# Patient Record
Sex: Female | Born: 1995 | Race: Black or African American | Hispanic: No | Marital: Single | Smoking: Never smoker
Health system: Southern US, Community
[De-identification: ages and names within clinical notes are randomized; demographics above are authoritative.]

## PROBLEM LIST (undated history)

## (undated) DIAGNOSIS — A64 Unspecified sexually transmitted disease: Secondary | ICD-10-CM

## (undated) DIAGNOSIS — O009 Unspecified ectopic pregnancy without intrauterine pregnancy: Secondary | ICD-10-CM

## (undated) DIAGNOSIS — S060XAA Concussion with loss of consciousness status unknown, initial encounter: Secondary | ICD-10-CM

## (undated) DIAGNOSIS — F329 Major depressive disorder, single episode, unspecified: Secondary | ICD-10-CM

## (undated) DIAGNOSIS — F32A Depression, unspecified: Secondary | ICD-10-CM

## (undated) DIAGNOSIS — S060X9A Concussion with loss of consciousness of unspecified duration, initial encounter: Secondary | ICD-10-CM

## (undated) DIAGNOSIS — B999 Unspecified infectious disease: Secondary | ICD-10-CM

## (undated) HISTORY — PX: DILATION AND CURETTAGE OF UTERUS: SHX78

## (undated) HISTORY — PX: WISDOM TOOTH EXTRACTION: SHX21

---
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
## 2016-04-24 ENCOUNTER — Encounter (HOSPITAL_COMMUNITY): Payer: Self-pay | Admitting: Emergency Medicine

## 2016-04-24 ENCOUNTER — Ambulatory Visit (HOSPITAL_COMMUNITY)
Admission: EM | Admit: 2016-04-24 | Discharge: 2016-04-24 | Disposition: A | Discharge status: Home / Self Care | Payer: Self-pay | Attending: Family Medicine | Admitting: Family Medicine

## 2016-04-24 DIAGNOSIS — N72 Inflammatory disease of cervix uteri: Secondary | ICD-10-CM | POA: Insufficient documentation

## 2016-04-24 DIAGNOSIS — N939 Abnormal uterine and vaginal bleeding, unspecified: Secondary | ICD-10-CM | POA: Insufficient documentation

## 2016-04-24 LAB — POCT PREGNANCY, URINE: PREG TEST UR: NEGATIVE

## 2016-04-24 MED ORDER — METRONIDAZOLE 500 MG PO TABS: 500.0000 mg | ORAL_TABLET | Freq: Two times a day (BID) | ORAL | 0 refills | Status: DC

## 2016-04-24 MED ORDER — DOXYCYCLINE HYCLATE 100 MG PO CAPS: 100.0000 mg | ORAL_CAPSULE | Freq: Two times a day (BID) | ORAL | 0 refills | Status: DC

## 2016-04-24 NOTE — ED Provider Notes (Signed)
CSN: [id number]     Arrival date & time 04/24/16  1431 History   First MD Initiated Contact with Patient 04/24/16 1526     Chief Complaint  Patient presents with  . Vaginal Bleeding   (Consider location/radiation/quality/duration/timing/severity/associated sxs/prior Treatment) 20 year old female states that her LMP was 04/03/2016 in lasted for approximately 8 days. This was right on time and a normal menstrual period for her. Yesterday she had some vaginal spotting, scant to mild amount and this was associated with a small amount of an abnormal discharge. She uses panty liners rather than pads due to the small amount of discharge and bleeding. Yesterday she had 2-3 minutes of mid pelvic cramping and has had none since that time.      History reviewed. No pertinent past medical history. Past Surgical History:  Procedure Laterality Date  . WISDOM TOOTH EXTRACTION     Family History  Problem Relation Age of Onset  . Adopted: Yes  . Cancer Mother    Social History  Substance Use Topics  . Smoking status: Never Smoker  . Smokeless tobacco: Never Used  . Alcohol use No   OB History    No data available     Review of Systems  Constitutional: Negative.   HENT: Negative.   Respiratory: Negative.   Gastrointestinal: Negative.   Genitourinary: Positive for vaginal bleeding and vaginal discharge. Negative for dysuria, frequency, genital sores, hematuria, menstrual problem and urgency.  Musculoskeletal: Negative.   Neurological: Negative.   All other systems reviewed and are negative.   Allergies  Review of patient's allergies indicates no known allergies.  Home Medications   Prior to Admission medications   Medication Sig Start Date End Date Taking? Authorizing Provider  Acetaminophen (TYLENOL PO) Take 2 tablets by mouth daily as needed (for headache).     Historical Provider, MD  doxycycline (VIBRAMYCIN) 100 MG capsule Take 1 capsule (100 mg total) by mouth 2 (two) times  daily. 04/24/16   Hayden Rasmussenavid Traylon Schimming, NP  metroNIDAZOLE (FLAGYL) 500 MG tablet Take 1 tablet (500 mg total) by mouth 2 (two) times daily. X 7 days 04/24/16   Hayden Rasmussenavid Nakeysha Pasqual, NP   Meds Ordered and Administered this Visit  Medications - No data to display  BP 121/55 (BP Location: Right Arm)   Temp 98.2 F (36.8 C) (Oral)   Resp 12   LMP 04/03/2016 (Exact Date)   SpO2 98%  No data found.   Physical Exam  Constitutional: She is oriented to person, place, and time. She appears well-developed and well-nourished.  HENT:  Head: Normocephalic and atraumatic.  Neck: Normal range of motion. Neck supple.  Cardiovascular: Normal rate.   Pulmonary/Chest: Effort normal.  Genitourinary:  Genitourinary Comments: Normal external female genitalia. Cervix nulliparous. Ectocervix deeply erythematous with small ectocervical papules. Friable, with an abraised appearance. No significant vaginal discharge. No bleeding or discharge from the os. Positive for CMT and left adnexal tenderness.  Musculoskeletal: She exhibits no edema.  Neurological: She is alert and oriented to person, place, and time.  Skin: Skin is warm and dry.  Psychiatric: She has a normal mood and affect.  Nursing note and vitals reviewed.   Urgent Care Course   Clinical Course    Procedures (including critical care time)  Labs Review Labs Reviewed  POCT PREGNANCY, URINE  CERVICOVAGINAL ANCILLARY ONLY    Imaging Review No results found.   Visual Acuity Review  Right Eye Distance:   Left Eye Distance:   Bilateral Distance:    Right  Eye Near:   Left Eye Near:    Bilateral Near:         MDM   1. Abnormal vaginal bleeding   2. Acute cervicitis    You have an infection of your cervix which is causing the bleeding. The blood does not appear to be coming from the uterus. Your being provided with 2 prescriptions/antibiotics to help with the most likely causes for this infection. Swabs have been obtained for additional  testing. If they return with a different type of infection that you need different antibiotics for then we will call you and likely be able to treat this over the telephone. For worsening or new symptoms or problems may return or follow-up with your gynecologist or PCP. Expect to have additional scant bleeding as a result of the pelvic exam today. This should not last long. Meds ordered this encounter  Medications  . metroNIDAZOLE (FLAGYL) 500 MG tablet    Sig: Take 1 tablet (500 mg total) by mouth 2 (two) times daily. X 7 days    Dispense:  14 tablet    Refill:  0    Order Specific Question:   Supervising Provider    Answer:   Linna Hoff [phone]  . doxycycline (VIBRAMYCIN) 100 MG capsule    Sig: Take 1 capsule (100 mg total) by mouth 2 (two) times daily.    Dispense:  20 capsule    Refill:  0    Order Specific Question:   Supervising Provider    Answer:   Bradd Canary D [[id number]]   Cervical cyto pending    Hayden Rasmussen, NP 04/24/16 1555

## 2016-04-24 NOTE — Discharge Instructions (Signed)
You have an infection of your cervix which is causing the bleeding. The blood does not appear to be coming from the uterus. Your being provided with 2 prescriptions/antibiotics to help with the most likely causes for this infection. Swabs have been obtained for additional testing. If they return with a different type of infection that you need different antibiotics for then we will call you and likely be able to treat this over the telephone. For worsening or new symptoms or problems may return or follow-up with your gynecologist or PCP. Expect to have additional scant bleeding as a result of the pelvic exam today. This should not last long.

## 2016-04-24 NOTE — ED Triage Notes (Signed)
[facility] bleeding that started yesterday. The patient stated that this is not time for her normal menstrual cycle. The patient denied any abdominal pain or vaginal discharge. The patient stated that she attempted to get an appointment with her OB/GYN but was turned away until December.

## 2016-04-25 LAB — CERVICOVAGINAL ANCILLARY ONLY
CHLAMYDIA, DNA PROBE: NEGATIVE
Neisseria Gonorrhea: NEGATIVE
Wet Prep (BD Affirm): NEGATIVE

## 2016-06-30 ENCOUNTER — Emergency Department (HOSPITAL_COMMUNITY)
Admission: EM | Admit: 2016-06-30 | Discharge: 2016-06-30 | Disposition: A | Discharge status: Home / Self Care | Payer: Medicaid Other | Attending: Emergency Medicine | Admitting: Emergency Medicine

## 2016-06-30 ENCOUNTER — Encounter (HOSPITAL_COMMUNITY): Payer: Self-pay | Admitting: Emergency Medicine

## 2016-06-30 DIAGNOSIS — Z5321 Procedure and treatment not carried out due to patient leaving prior to being seen by health care provider: Secondary | ICD-10-CM | POA: Insufficient documentation

## 2016-06-30 DIAGNOSIS — R109 Unspecified abdominal pain: Secondary | ICD-10-CM | POA: Diagnosis present

## 2016-06-30 LAB — COMPREHENSIVE METABOLIC PANEL
ALT: 8 U/L — ABNORMAL LOW (ref 14–54)
AST: 18 U/L (ref 15–41)
Albumin: 4.2 g/dL (ref 3.5–5.0)
Alkaline Phosphatase: 57 U/L (ref 38–126)
Anion gap: 8 (ref 5–15)
BUN: 8 mg/dL (ref 6–20)
CO2: 21 mmol/L — ABNORMAL LOW (ref 22–32)
Calcium: 9.3 mg/dL (ref 8.9–10.3)
Chloride: 106 mmol/L (ref 101–111)
Creatinine, Ser: 0.8 mg/dL (ref 0.44–1.00)
GFR calc Af Amer: >60 mL/min (ref >60)
GFR calc non Af Amer: >60 mL/min (ref >60)
Glucose, Bld: 84 mg/dL (ref 65–99)
Potassium: 4.1 mmol/L (ref 3.5–5.1)
Sodium: 135 mmol/L (ref 135–145)
Total Bilirubin: 0.9 mg/dL (ref 0.3–1.2)
Total Protein: 7.1 g/dL (ref 6.5–8.1)

## 2016-06-30 LAB — CBC
HCT: 38.1 % (ref 36.0–46.0)
Hemoglobin: 12.9 g/dL (ref 12.0–15.0)
MCH: 28.9 pg (ref 26.0–34.0)
MCHC: 33.9 g/dL (ref 30.0–36.0)
MCV: 85.4 fL (ref 78.0–100.0)
Platelets: 312 10*3/uL (ref 150–400)
RBC: 4.46 MIL/uL (ref 3.87–5.11)
RDW: 12.9 % (ref 11.5–15.5)
WBC: 6.9 10*3/uL (ref 4.0–10.5)

## 2016-06-30 LAB — I-STAT BETA HCG BLOOD, ED (MC, WL, AP ONLY): I-stat hCG, quantitative: 434.1 m[IU]/mL — ABNORMAL HIGH (ref <5)

## 2016-06-30 LAB — LIPASE, BLOOD: Lipase: 23 U/L (ref 11–51)

## 2016-06-30 NOTE — ED Triage Notes (Signed)
Pt c/o abdominal pain with nausea since Wednesday. Pt reports feels like lumps are in her stomach. Pt also reports unable to have bowel movement since Wednesday. Pt also reports abnormal menstrual cycle this month.

## 2016-07-02 DIAGNOSIS — O009 Unspecified ectopic pregnancy without intrauterine pregnancy: Secondary | ICD-10-CM

## 2016-07-02 HISTORY — DX: Unspecified ectopic pregnancy without intrauterine pregnancy: O00.90

## 2016-07-31 ENCOUNTER — Encounter (HOSPITAL_COMMUNITY)
Admission: EM | Disposition: A | Discharge status: Home / Self Care | Payer: Self-pay | Source: Home / Self Care | Attending: Emergency Medicine

## 2016-07-31 ENCOUNTER — Observation Stay (HOSPITAL_COMMUNITY): Payer: Medicaid Other | Admitting: Anesthesiology

## 2016-07-31 ENCOUNTER — Observation Stay (HOSPITAL_COMMUNITY)
Admission: EM | Admit: 2016-07-31 | Discharge: 2016-07-31 | Disposition: A | Discharge status: Home / Self Care | Payer: Medicaid Other | Attending: Obstetrics and Gynecology | Admitting: Obstetrics and Gynecology

## 2016-07-31 ENCOUNTER — Encounter (HOSPITAL_COMMUNITY): Payer: Self-pay | Admitting: Emergency Medicine

## 2016-07-31 ENCOUNTER — Emergency Department (HOSPITAL_COMMUNITY): Payer: Medicaid Other

## 2016-07-31 DIAGNOSIS — K661 Hemoperitoneum: Secondary | ICD-10-CM | POA: Insufficient documentation

## 2016-07-31 DIAGNOSIS — O00102 Left tubal pregnancy without intrauterine pregnancy: Principal | ICD-10-CM

## 2016-07-31 DIAGNOSIS — O4690 Antepartum hemorrhage, unspecified, unspecified trimester: Secondary | ICD-10-CM | POA: Diagnosis not present

## 2016-07-31 DIAGNOSIS — Z791 Long term (current) use of non-steroidal anti-inflammatories (NSAID): Secondary | ICD-10-CM | POA: Insufficient documentation

## 2016-07-31 DIAGNOSIS — N939 Abnormal uterine and vaginal bleeding, unspecified: Secondary | ICD-10-CM

## 2016-07-31 DIAGNOSIS — O9989 Other specified diseases and conditions complicating pregnancy, childbirth and the puerperium: Secondary | ICD-10-CM | POA: Diagnosis not present

## 2016-07-31 DIAGNOSIS — O009 Unspecified ectopic pregnancy without intrauterine pregnancy: Secondary | ICD-10-CM | POA: Diagnosis present

## 2016-07-31 DIAGNOSIS — N7011 Chronic salpingitis: Secondary | ICD-10-CM | POA: Diagnosis not present

## 2016-07-31 HISTORY — DX: Unspecified infectious disease: B99.9

## 2016-07-31 HISTORY — DX: Concussion with loss of consciousness of unspecified duration, initial encounter: S06.0X9A

## 2016-07-31 HISTORY — DX: Unspecified ectopic pregnancy without intrauterine pregnancy: O00.90

## 2016-07-31 HISTORY — DX: Major depressive disorder, single episode, unspecified: F32.9

## 2016-07-31 HISTORY — DX: Unspecified sexually transmitted disease: A64

## 2016-07-31 HISTORY — DX: Concussion with loss of consciousness status unknown, initial encounter: S06.0XAA

## 2016-07-31 HISTORY — DX: Depression, unspecified: F32.A

## 2016-07-31 HISTORY — PX: LAPAROSCOPY: SHX197

## 2016-07-31 LAB — COMPREHENSIVE METABOLIC PANEL
ALK PHOS: 64 U/L (ref 38–126)
ALT: 12 U/L — AB (ref 14–54)
AST: 20 U/L (ref 15–41)
Albumin: 4 g/dL (ref 3.5–5.0)
Anion gap: 5 (ref 5–15)
BILIRUBIN TOTAL: 0.8 mg/dL (ref 0.3–1.2)
BUN: 10 mg/dL (ref 6–20)
CALCIUM: 8.6 mg/dL — AB (ref 8.9–10.3)
CO2: 25 mmol/L (ref 22–32)
CREATININE: 0.83 mg/dL (ref 0.44–1.00)
Chloride: 106 mmol/L (ref 101–111)
Glucose, Bld: 102 mg/dL — ABNORMAL HIGH (ref 65–99)
Potassium: 3.3 mmol/L — ABNORMAL LOW (ref 3.5–5.1)
Sodium: 136 mmol/L (ref 135–145)
TOTAL PROTEIN: 6.6 g/dL (ref 6.5–8.1)

## 2016-07-31 LAB — CBC
HCT: 33.1 % — ABNORMAL LOW (ref 36.0–46.0)
HCT: 28.6 % — ABNORMAL LOW (ref 36.0–46.0)
HEMOGLOBIN: 9.8 g/dL — AB (ref 12.0–15.0)
Hemoglobin: 11.3 g/dL — ABNORMAL LOW (ref 12.0–15.0)
MCH: 29.4 pg (ref 26.0–34.0)
MCH: 29.6 pg (ref 26.0–34.0)
MCHC: 34.1 g/dL (ref 30.0–36.0)
MCHC: 34.3 g/dL (ref 30.0–36.0)
MCV: 86 fL (ref 78.0–100.0)
MCV: 86.4 fL (ref 78.0–100.0)
PLATELETS: 284 10*3/uL (ref 150–400)
PLATELETS: 262 10*3/uL (ref 150–400)
RBC: 3.85 MIL/uL — AB (ref 3.87–5.11)
RBC: 3.31 MIL/uL — AB (ref 3.87–5.11)
RDW: 12.8 % (ref 11.5–15.5)
RDW: 13 % (ref 11.5–15.5)
WBC: 7.5 10*3/uL (ref 4.0–10.5)
WBC: 9.5 10*3/uL (ref 4.0–10.5)

## 2016-07-31 LAB — URINALYSIS, ROUTINE W REFLEX MICROSCOPIC
BILIRUBIN URINE: NEGATIVE
Glucose, UA: NEGATIVE mg/dL
Ketones, ur: NEGATIVE mg/dL
LEUKOCYTES UA: NEGATIVE
Nitrite: NEGATIVE
PH: 7 (ref 5.0–8.0)
Protein, ur: 100 mg/dL — AB
SPECIFIC GRAVITY, URINE: 1.028 (ref 1.005–1.030)

## 2016-07-31 LAB — I-STAT CHEM 8, ED
BUN: 9 mg/dL (ref 6–20)
CALCIUM ION: 1.14 mmol/L — AB (ref 1.15–1.40)
CHLORIDE: 104 mmol/L (ref 101–111)
Creatinine, Ser: 0.9 mg/dL (ref 0.44–1.00)
Glucose, Bld: 88 mg/dL (ref 65–99)
HEMATOCRIT: 34 % — AB (ref 36.0–46.0)
Hemoglobin: 11.6 g/dL — ABNORMAL LOW (ref 12.0–15.0)
Potassium: 4 mmol/L (ref 3.5–5.1)
Sodium: 140 mmol/L (ref 135–145)
TCO2: 25 mmol/L (ref 0–100)

## 2016-07-31 LAB — WET PREP, GENITAL
SPERM: NONE SEEN
TRICH WET PREP: NONE SEEN
Yeast Wet Prep HPF POC: NONE SEEN

## 2016-07-31 LAB — I-STAT BETA HCG BLOOD, ED (MC, WL, AP ONLY): I-stat hCG, quantitative: 550.1 m[IU]/mL — ABNORMAL HIGH (ref <5)

## 2016-07-31 LAB — PREPARE RBC (CROSSMATCH)

## 2016-07-31 LAB — ABO/RH: ABO/RH(D): O POS

## 2016-07-31 SURGERY — LAPAROSCOPY, DIAGNOSTIC
Anesthesia: General | Site: Abdomen | Laterality: Left

## 2016-07-31 MED ORDER — PROPOFOL 10 MG/ML IV BOLUS
INTRAVENOUS | Status: AC
  Filled 2016-07-31: qty 20

## 2016-07-31 MED ORDER — ONDANSETRON HCL 4 MG/2ML IJ SOLN
4.0000 mg | Freq: Once | INTRAMUSCULAR | Status: AC
  Administered 2016-07-31: 4 mg via INTRAVENOUS
  Filled 2016-07-31: qty 2

## 2016-07-31 MED ORDER — MIDAZOLAM HCL 2 MG/2ML IJ SOLN
INTRAMUSCULAR | Status: AC
  Filled 2016-07-31: qty 2

## 2016-07-31 MED ORDER — HYDROMORPHONE HCL 1 MG/ML IJ SOLN: 0.2500 mg | INTRAMUSCULAR | Status: DC | PRN

## 2016-07-31 MED ORDER — FENTANYL CITRATE (PF) 100 MCG/2ML IJ SOLN
INTRAMUSCULAR | Status: DC | PRN
  Administered 2016-07-31: 50 ug via INTRAVENOUS
  Administered 2016-07-31: 100 ug via INTRAVENOUS
  Administered 2016-07-31 (×2): 50 ug via INTRAVENOUS

## 2016-07-31 MED ORDER — LACTATED RINGERS IR SOLN
Status: DC | PRN
  Administered 2016-07-31: 3000 mL

## 2016-07-31 MED ORDER — SCOPOLAMINE 1 MG/3DAYS TD PT72
MEDICATED_PATCH | TRANSDERMAL | Status: DC | PRN
  Administered 2016-07-31: 1 via TRANSDERMAL

## 2016-07-31 MED ORDER — HYDROCODONE-ACETAMINOPHEN 7.5-325 MG PO TABS
ORAL_TABLET | ORAL | Status: AC
  Filled 2016-07-31: qty 1

## 2016-07-31 MED ORDER — ONDANSETRON HCL 4 MG/2ML IJ SOLN
INTRAMUSCULAR | Status: DC | PRN
  Administered 2016-07-31: 4 mg via INTRAVENOUS

## 2016-07-31 MED ORDER — LACTATED RINGERS IV BOLUS (SEPSIS)
1000.0000 mL | Freq: Once | INTRAVENOUS | Status: AC
  Administered 2016-07-31: 1000 mL via INTRAVENOUS

## 2016-07-31 MED ORDER — PROPOFOL 10 MG/ML IV BOLUS
INTRAVENOUS | Status: DC | PRN
  Administered 2016-07-31: 180 mg via INTRAVENOUS

## 2016-07-31 MED ORDER — DEXAMETHASONE SODIUM PHOSPHATE 4 MG/ML IJ SOLN
INTRAMUSCULAR | Status: AC
  Filled 2016-07-31: qty 1

## 2016-07-31 MED ORDER — HYDROMORPHONE HCL 1 MG/ML IJ SOLN
INTRAMUSCULAR | Status: DC | PRN
  Administered 2016-07-31: 0.5 mg via INTRAVENOUS

## 2016-07-31 MED ORDER — LIDOCAINE HCL (CARDIAC) 20 MG/ML IV SOLN
INTRAVENOUS | Status: DC | PRN
  Administered 2016-07-31: 80 mg via INTRAVENOUS

## 2016-07-31 MED ORDER — NEOSTIGMINE METHYLSULFATE 10 MG/10ML IV SOLN
INTRAVENOUS | Status: DC | PRN
  Administered 2016-07-31: 3 mg via INTRAVENOUS

## 2016-07-31 MED ORDER — ROCURONIUM BROMIDE 100 MG/10ML IV SOLN
INTRAVENOUS | Status: DC | PRN
  Administered 2016-07-31: 40 mg via INTRAVENOUS
  Administered 2016-07-31: 10 mg via INTRAVENOUS

## 2016-07-31 MED ORDER — ROCURONIUM BROMIDE 100 MG/10ML IV SOLN
INTRAVENOUS | Status: AC
  Filled 2016-07-31: qty 1

## 2016-07-31 MED ORDER — FAMOTIDINE IN NACL 20-0.9 MG/50ML-% IV SOLN
20.0000 mg | Freq: Once | INTRAVENOUS | Status: AC
  Administered 2016-07-31: 20 mg via INTRAVENOUS
  Filled 2016-07-31: qty 50

## 2016-07-31 MED ORDER — MEPERIDINE HCL 25 MG/ML IJ SOLN
6.2500 mg | INTRAMUSCULAR | Status: DC | PRN
Start: 2016-07-31 — End: 2016-07-31

## 2016-07-31 MED ORDER — SOD CITRATE-CITRIC ACID 500-334 MG/5ML PO SOLN
30.0000 mL | Freq: Once | ORAL | Status: AC
  Administered 2016-07-31: 30 mL via ORAL
  Filled 2016-07-31: qty 15

## 2016-07-31 MED ORDER — HYDROMORPHONE HCL 1 MG/ML IJ SOLN
INTRAMUSCULAR | Status: AC
  Filled 2016-07-31: qty 1

## 2016-07-31 MED ORDER — LACTATED RINGERS IV SOLN
INTRAVENOUS | Status: DC
  Administered 2016-07-31 (×2): via INTRAVENOUS

## 2016-07-31 MED ORDER — LIDOCAINE HCL (CARDIAC) 20 MG/ML IV SOLN
INTRAVENOUS | Status: AC
  Filled 2016-07-31: qty 5

## 2016-07-31 MED ORDER — METOCLOPRAMIDE HCL 5 MG/ML IJ SOLN
INTRAMUSCULAR | Status: AC
  Filled 2016-07-31: qty 2

## 2016-07-31 MED ORDER — KETOROLAC TROMETHAMINE 15 MG/ML IJ SOLN
15.0000 mg | Freq: Once | INTRAMUSCULAR | Status: AC
  Administered 2016-07-31: 15 mg via INTRAVENOUS
  Filled 2016-07-31: qty 1

## 2016-07-31 MED ORDER — ONDANSETRON HCL 4 MG/2ML IJ SOLN
INTRAMUSCULAR | Status: AC
  Filled 2016-07-31: qty 2

## 2016-07-31 MED ORDER — SILVER NITRATE-POT NITRATE 75-25 % EX MISC
CUTANEOUS | Status: DC | PRN
  Administered 2016-07-31: 2

## 2016-07-31 MED ORDER — SODIUM CHLORIDE 0.9 % IV SOLN: INTRAVENOUS | Status: DC

## 2016-07-31 MED ORDER — BUPIVACAINE HCL 0.5 % IJ SOLN
INTRAMUSCULAR | Status: DC | PRN
  Administered 2016-07-31: 8.5 mL

## 2016-07-31 MED ORDER — HYDROCODONE-ACETAMINOPHEN 7.5-325 MG PO TABS
1.0000 | ORAL_TABLET | Freq: Once | ORAL | Status: AC | PRN
  Administered 2016-07-31: 1 via ORAL

## 2016-07-31 MED ORDER — DEXAMETHASONE SODIUM PHOSPHATE 10 MG/ML IJ SOLN
INTRAMUSCULAR | Status: DC | PRN
  Administered 2016-07-31: 8 mg via INTRAVENOUS

## 2016-07-31 MED ORDER — METOCLOPRAMIDE HCL 5 MG/ML IJ SOLN
INTRAMUSCULAR | Status: DC | PRN
  Administered 2016-07-31: 10 mg via INTRAVENOUS

## 2016-07-31 MED ORDER — METOCLOPRAMIDE HCL 5 MG/ML IJ SOLN: 10.0000 mg | Freq: Once | INTRAMUSCULAR | Status: DC | PRN

## 2016-07-31 MED ORDER — FENTANYL CITRATE (PF) 250 MCG/5ML IJ SOLN
INTRAMUSCULAR | Status: AC
  Filled 2016-07-31: qty 5

## 2016-07-31 MED ORDER — GLYCOPYRROLATE 0.2 MG/ML IJ SOLN
INTRAMUSCULAR | Status: DC | PRN
  Administered 2016-07-31: 0.1 mg via INTRAVENOUS
  Administered 2016-07-31: 0.6 mg via INTRAVENOUS

## 2016-07-31 MED ORDER — MIDAZOLAM HCL 2 MG/2ML IJ SOLN
INTRAMUSCULAR | Status: DC | PRN
  Administered 2016-07-31: 2 mg via INTRAVENOUS

## 2016-07-31 MED ORDER — OXYCODONE-ACETAMINOPHEN 5-325 MG PO TABS: 1.0000 | ORAL_TABLET | Freq: Four times a day (QID) | ORAL | 0 refills | Status: DC | PRN

## 2016-07-31 SURGICAL SUPPLY — 46 items
APPLICATOR COTTON TIP 6IN STRL (MISCELLANEOUS) IMPLANT
BLADE SURG 15 STRL LF C SS BP (BLADE) ×1 IMPLANT
BLADE SURG 15 STRL SS (BLADE) ×2
CABLE HIGH FREQUENCY MONO STRZ (ELECTRODE) IMPLANT
CLOTH BEACON ORANGE TIMEOUT ST (SAFETY) ×3 IMPLANT
DEFOGGER SCOPE WARMER CLEARIFY (MISCELLANEOUS) IMPLANT
DERMABOND ADVANCED (GAUZE/BANDAGES/DRESSINGS) ×2
DERMABOND ADVANCED .7 DNX12 (GAUZE/BANDAGES/DRESSINGS) ×1 IMPLANT
DRSG OPSITE POSTOP 3X4 (GAUZE/BANDAGES/DRESSINGS) ×3 IMPLANT
DURAPREP 26ML APPLICATOR (WOUND CARE) ×3 IMPLANT
ELECT REM PT RETURN 9FT ADLT (ELECTROSURGICAL) ×3
ELECTRODE REM PT RTRN 9FT ADLT (ELECTROSURGICAL) ×1 IMPLANT
GLOVE BIO SURGEON STRL SZ7 (GLOVE) ×6 IMPLANT
GLOVE BIOGEL PI IND STRL 7.0 (GLOVE) ×2 IMPLANT
GLOVE BIOGEL PI IND STRL 7.5 (GLOVE) ×1 IMPLANT
GLOVE BIOGEL PI INDICATOR 7.0 (GLOVE) ×4
GLOVE BIOGEL PI INDICATOR 7.5 (GLOVE) ×2
GOWN STRL REUS W/TWL LRG LVL3 (GOWN DISPOSABLE) ×9 IMPLANT
LIGASURE VESSEL 5MM BLUNT TIP (ELECTROSURGICAL) IMPLANT
NDL INSUFF ACCESS 14 VERSASTEP (NEEDLE) ×3 IMPLANT
NS IRRIG 1000ML POUR BTL (IV SOLUTION) ×3 IMPLANT
PACK LAPAROSCOPY BASIN (CUSTOM PROCEDURE TRAY) ×3 IMPLANT
PACK TRENDGUARD 450 HYBRID PRO (MISCELLANEOUS) ×1 IMPLANT
PACK TRENDGUARD 600 HYBRD PROC (MISCELLANEOUS) IMPLANT
PAD OB MATERNITY 4.3X12.25 (PERSONAL CARE ITEMS) ×3 IMPLANT
POUCH LAPAROSCOPIC INSTRUMENT (MISCELLANEOUS) ×3 IMPLANT
POUCH SPECIMEN RETRIEVAL 10MM (ENDOMECHANICALS) IMPLANT
PROTECTOR NERVE ULNAR (MISCELLANEOUS) ×3 IMPLANT
SCISSORS LAP 5X35 DISP (ENDOMECHANICALS) IMPLANT
SET IRRIG TUBING LAPAROSCOPIC (IRRIGATION / IRRIGATOR) ×3 IMPLANT
SLEEVE ADV FIXATION 5X100MM (TROCAR) IMPLANT
SLEEVE XCEL OPT CAN 5 100 (ENDOMECHANICALS) ×3 IMPLANT
SUT MON AB 4-0 PS1 27 (SUTURE) ×3 IMPLANT
SUT VICRYL 0 UR6 27IN ABS (SUTURE) ×3 IMPLANT
SYRINGE 10CC LL (SYRINGE) IMPLANT
SYSTEM CARTER THOMASON II (TROCAR) ×3 IMPLANT
TOWEL OR 17X24 6PK STRL BLUE (TOWEL DISPOSABLE) ×6 IMPLANT
TRENDGUARD 450 HYBRID PRO PACK (MISCELLANEOUS) ×3
TRENDGUARD 600 HYBRID PROC PK (MISCELLANEOUS)
TROCAR ADV FIXATION 5X100MM (TROCAR) ×3 IMPLANT
TROCAR BALLN 12MMX100 BLUNT (TROCAR) ×3 IMPLANT
TROCAR VERSASTEP PLUS 12MM (TROCAR) IMPLANT
TROCAR VERSASTEP PLUS 5MM (TROCAR) ×3 IMPLANT
TROCAR XCEL NON-BLD 11X100MML (ENDOMECHANICALS) ×3 IMPLANT
TROCAR XCEL NON-BLD 5MMX100MML (ENDOMECHANICALS) ×3 IMPLANT
WARMER LAPAROSCOPE (MISCELLANEOUS) ×3 IMPLANT

## 2016-07-31 NOTE — ED Triage Notes (Signed)
Per EMS- pt called EMS with c/o vaginal bleeding and abdominal cramping., Last period 06/23/2016. Pt is alert, oriented and appropriate, ambulatory at the scene. Pain and bleeding started at 0845 this am.

## 2016-07-31 NOTE — Transfer of Care (Signed)
Immediate Anesthesia Transfer of Care Note  Patient: Phyllis Nichols  Procedure(s) Performed: Procedure(s) with comments: LAPAROSCOPY DIAGNOSTIC WITH LEFT  SALPINGECTOMY (Left) - Left sided ectopic    Patient Location: PACU  Anesthesia Type:General  Level of Consciousness: awake, alert  and oriented  Airway & Oxygen Therapy: Patient Spontanous Breathing and Patient connected to nasal cannula oxygen  Post-op Assessment: Report given to RN and Post -op Vital signs reviewed and stable  Post vital signs: Reviewed and stable  Last Vitals:  Vitals:   07/31/16 1309 07/31/16 1433  BP: 124/67 109/64  Pulse: 93 76  Resp: 16 16  Temp:  36.8 C    Last Pain:  Vitals:   07/31/16 1437  TempSrc:   PainSc: 3          Complications: No apparent anesthesia complications

## 2016-07-31 NOTE — Op Note (Signed)
Operative Note   07/31/2016  PRE-OP DIAGNOSIS: Left sided ectopic pregnancy on ultrasound. Moderate free fluid in the pelvis.    POST-OP DIAGNOSIS: Same. Right sided hydrosalpinx. Hemoperitoneum  SURGEON: Surgeon(s) and Role:    * [place] Bing, MD - Primary  ASSISTANT: Scheryl Darter, MD  ANESTHESIA: General and local  PROCEDURE: Diagnostic laparoscopy, left salpingectomy  ESTIMATED BLOOD LOSS: 10mL for the case with of hemoperitoneum  DRAINS: indwelling foley UOP   TOTAL IV FLUIDS: crystalloid  SPECIMENS: left fallopian tube  FROZEN: +chorionic villi on pathology from left fallopian tube that was sent to pathology  VTE PROPHYLAXIS: SCDs to the bilateral lower extremities  ANTIBIOTICS: not indicated  COMPLICATIONS: Case was prolonged by approximately 1 hour due to having to wait for pathology to come from another hospital; we called them at the start of the case but due to miscommunication, they didn't come until we called a second time when the specimen was out.   DISPOSITION: PACU - hemodynamically stable.  CONDITION: stable  FINDINGS: Exam under anesthesia revealed an anteverted uterus small mid plane uterus that sounded to 3 inches.  Laparoscopic survey of the abdomen revealed a grossly normal uterus, ovaries, liver and stomach edge; no intra-abdominal adhesions were noted.  The left fallopian tube was enlarged and approximately 8cm with edema and old blood clot coming from the fimbraie; no active bleeding. The right tube was noted to be edematous in the rest of the tube except for the proximal 2cm cornual aspect of the tube. The abnormal part was diffusely dilated to approximately 1cm in width along the rest of the tube.  Approximately of hemoperitoneum in the pelvis   PROCEDURE IN DETAIL: The patient was taken to the OR where anesthesia was administed. The patient was positioned in dorsal lithotomy in the Lamar stirrups. The patient was then  examined under anesthesia with the above noted findings. The patient was prepped and draped in the normal sterile fashion and foley catheter was placed. A Graves speculum was placed in the vagina and the anterior lip of the cervix was grasped with a single toothed tenaculum.  A Hulka uterine manipulator was then inserted in the uterus and uterine mobility was found to be satisfactory; the speculum was then removed.   After changing gloves, attention was turned to the patient's abdomen where a  5mm [place] incision was made, after confirmation of an NG/OG tube was in place. Using the veress step needle, the abdomen was entered with an opening pressure of and negative aspiration and + saline drop test. The abdomen was the then insufflated to and the port and then the camera inserted. The aspect just below the port was inspected and noted to be w/o injury and the below noted findings now and after Trendelenburg was done. A 10mm suprapubic port was then placed and a 5mm right and left lower quadrant port; all done under direct visualization.  After suction irrigation was done, the left tube and ovary were then removed by using the Ligasure. An endocatch bag was used to remove the specimen through the suprapubic port and sent to pathology. After long discussion and thought with the patient's mom, the decision was made to leave the right tube in place, in case another physician may deem it able to be salvaged, after +villi on pathology of the other specimen. The Carter-Thomson II device was used to close the fascia of the suprapubic port with 0 vicryl with one side to side  stitch   All instruments and ports were then removed from the abdomen under direct visualization except for the [facility] and then this was removed after insertion of a blunt probe. . The fascia at the umbilical incision was then tied. The skin was closed with 4-0 monocryl and the other sites closed with liquiband. The Hulka  was removed with no bleeding noted from the cervix and all other instrumentation was removed from the vagina.  The foley catheter was removed. The patient tolerated the procedure well. All counts were correct x 2. The patient was transferred to the recovery room awake, alert and breathing independently.  Will have the patient come back in a few days to make sure the beta was trending down given blood was being extruded from the tube.    Cornelia Copaharlie Lilianah Buffin, Jr MD Attending Center for [facility](Faculty Practice)

## 2016-07-31 NOTE — ED Triage Notes (Signed)
Pt states it is time for her period but she is having to use towels to soak up the blood.  Pt states she is having severe abd pain.  Pt states "I've been having a lot of sex".  LMP 06/23/16.

## 2016-07-31 NOTE — Discharge Instructions (Signed)
Please remove nausea patch behind ear Wednesday night. Wash hands after removing patch. Drink enough clear liquids to keep urine light yellow to clear.   Diagnostic Laparoscopy, Care After Introduction Refer to this sheet in the next few weeks. These instructions provide you with information about caring for yourself after your procedure. Your health care provider may also give you more specific instructions. Your treatment has been planned according to current medical practices, but problems sometimes occur. Call your health care provider if you have any problems or questions after your procedure. What can I expect after the procedure? After your procedure, it is common to have mild discomfort in the throat and abdomen. Follow these instructions at home:  Take over-the-counter and prescription medicines only as told by your health care provider.  Do not drive for 24 hours if you received a sedative.  Return to your normal activities as told by your health care provider.  Do not take baths, swim, or use a hot tub until your health care provider approves. You may shower.  Follow instructions from your health care provider about how to take care of your incision. Make sure you:  Wash your hands with soap and water before you change your bandage (dressing). If soap and water are not available, use hand sanitizer.  Change your dressing as told by your health care provider.  Leave stitches (sutures), skin glue, or adhesive strips in place. These skin closures may need to stay in place for 2 weeks or longer. If adhesive strip edges start to loosen and curl up, you may trim the loose edges. Do not remove adhesive strips completely unless your health care provider tells you to do that.  Check your incision area every day for signs of infection. Check for:  More redness, swelling, or pain.  More fluid or blood.  Warmth.  Pus or a bad smell.  It is your responsibility to get the results of  your procedure. Ask your health care provider or the department performing the procedure when your results will be ready. Contact a health care provider if:  There is new pain in your shoulders.  You feel light-headed or faint.  You are unable to pass gas or unable to have a bowel movement.  You feel nauseous or you vomit.  You develop a rash.  You have more redness, swelling, or pain around your incision.  You have more fluid or blood coming from your incision.  Your incision feels warm to the touch.  You have pus or a bad smell coming from your incision.  You have a fever or chills. Get help right away if:  Your pain is getting worse.  You have ongoing vomiting.  The edges of your incision open up.  You have trouble breathing.  You have chest pain. This information is not intended to replace advice given to you by your health care provider. Make sure you discuss any questions you have with your health care provider. Document Released: 05/30/2015 Document Revised: 11/24/2015 Document Reviewed: 03/01/2015  2017 Elsevier Laparoscopic Surgery Discharge Instructions  Instructions Following Laparoscopic Surgery You have just undergone a laparoscopic surgery.  The following list should answer your most common questions.  Although we will discuss your surgery and post-operative instructions with you prior to your discharge, this list will serve as a reminder if you fail to recall the details of what we discussed.  We will discuss your surgery once again in detail at your post-op visit in two to four  weeks. If you havent already done so, please call to make your appointment as soon as possible.  How you will feel: Although you have just undergone a major surgery, your recovery will be significantly shorter since the surgery was performed through much smaller incisions than the traditional approach.  You should feel slightly better each day.  If you suddenly feel much worse than  the prior day, please call the clinic.  Its important during the early part of your recovery that you maintain some activity.  Walking is encouraged.  You will quicken your recovery by continued activity.  Incision:  Your incisions will be closed with dissolvable stitches or surgical adhesive (glue).  There may be Band-aids and/or Steri-strips covering your incisions.  If there is no drainage from the incisions you may remove the Band-aids in one to two days.  You may notice some minor bruising at the incision sites.  This is common and will resolve within several days.  Please inform us if the redness at the edges of your incision appears to be spreading.  If the skin around your incision becomes warm to the touch, or if you notice a pus-like drainage, please call the office.  Sexual Activity: Do not have sexual intercourse or place tampons or douches in the vagina prior to your first office visit.  We will discuss when you may resume these activities at that visit.    Stairs/Driving/Activities: You may climb stairs if necessary.  If youve had general anesthesia, do not drive a car the rest of the day today.  You may begin light housework when you feel up to it, but avoid heavy lifting (more than 15-20lbs) or pushing until cleared for these activities by your physician.  Hygiene:  Do not soak your incisions.  Showers are acceptable but you may not take a bath or swim in a pool.  Cleanse your incisions daily with soap and water.  Medications:  Please resume taking any medications that you were taking prior to the surgery.  If we have prescribed any new medications for you, please take them as directed.  Constipation:  It is fairly common to experience some difficulty in moving your bowels following major surgery.  Being active will help to reduce this likelihood. A diet rich in fiber and plenty of liquids is desirable.  If you do become constipated, a mild laxative such as Miralax, Milk of Magnesia, or  Metamucil, or a stool softener such as Colace, is recommended.  General Instructions: If you develop a fever of 100.5 degrees or higher, please call the office number(s) below for physician on call.

## 2016-07-31 NOTE — Anesthesia Preprocedure Evaluation (Addendum)
Anesthesia Evaluation  Patient identified by MRN, date of birth, ID band Patient awake    Reviewed: Allergy & Precautions, NPO status , Patient's Chart, lab work & pertinent test results  Airway Mallampati: II  TM Distance: >3 FB Neck ROM: Full    Dental no notable dental hx. (+) Teeth Intact   Pulmonary neg pulmonary ROS,    Pulmonary exam normal breath sounds clear to auscultation       Cardiovascular negative cardio ROS Normal cardiovascular exam Rhythm:Regular Rate:Normal     Neuro/Psych PSYCHIATRIC DISORDERS Depression negative neurological ROS     GI/Hepatic negative GI ROS, Neg liver ROS,   Endo/Other  negative endocrine ROS  Renal/GU negative Renal ROS  negative genitourinary   Musculoskeletal negative musculoskeletal ROS (+)   Abdominal (+)  Abdomen: tender.    Peds  Hematology  (+) anemia ,   Anesthesia Other Findings   Reproductive/Obstetrics (+) Pregnancy Ectopic pregnancy                            Anesthesia Physical Anesthesia Plan  ASA: II and emergent  Anesthesia Plan:    Post-op Pain Management:    Induction: Intravenous, Rapid sequence and Cricoid pressure planned  Airway Management Planned: Oral ETT  Additional Equipment:   Intra-op Plan:   Post-operative Plan: Extubation in OR  Informed Consent: I have reviewed the patients History and Physical, chart, labs and discussed the procedure including the risks, benefits and alternatives for the proposed anesthesia with the patient or authorized representative who has indicated his/her understanding and acceptance.   Dental advisory given  Plan Discussed with: Anesthesiologist, CRNA and Surgeon  Anesthesia Plan Comments:         Anesthesia Quick Evaluation

## 2016-07-31 NOTE — OR Nursing (Signed)
Family notified of patients condition and surgical progress.

## 2016-07-31 NOTE — H&P (Signed)
Obstetrics & Gynecology H&P   Date of Admission: 07/31/2016   Requesting Provider: [facility];    Primary OBGYN: None  Primary Care Provider: No PCP Per Patient  CC: left sided ectopic pregnancy  History of Present Illness: Phyllis Nichols is a 21 y.o. G2P0010 (Patient's last menstrual period was 06/23/2016.), with the above CC. PMHx is significant for h/o STD in the past.    Patient presented to ER on 12/30 but states she LWBS and had beta in the 430s. She presented to the [facility] ER today for abdominal pain and some VB. Quant only in the 500s and 8cm likely left sided ectopic diagnosed with moderate FF in the pelvis. Patient hemodynamically stable and transferred to MAU at [facility]. Patient only with pain with palpation, minimal VB.   ROS:  as stated in the above HPI.  OBGYN History: As per HPI. OB History  Gravida Para Term Preterm AB Living  2       1    SAB TAB Ectopic Multiple Live Births    1 0        # Outcome Date GA Lbr Len/2nd Weight Sex Delivery Anes PTL Lv  2 Current           1 TAB                Past Medical History: History reviewed. No pertinent past medical history.  Past Surgical History: Past Surgical History:  Procedure Laterality Date  . DILATION AND CURETTAGE OF UTERUS     TAB  . WISDOM TOOTH EXTRACTION      Family History:  Family History  Problem Relation Age of Onset  . Adopted: Yes  . Cancer Mother     Social History:  Social History   Social History  . Marital status: Single    Spouse name: N/A  . Number of children: N/A  . Years of education: N/A   Occupational History  . Not on file.   Social History Main Topics  . Smoking status: Never Smoker  . Smokeless tobacco: Never Used  . Alcohol use Yes     Comment: occassional  . Drug use: No  . Sexual activity: Not on file   Other Topics Concern  . Not on file   Social History Narrative  . No narrative on file    Allergy: No Known Allergies  Current Outpatient  Medications: Ibuprofen PRN  Physical Exam:  Current Vital Signs 24h Vital Sign Ranges  T 98.3 F (36.8 C) Temp  Avg: 98.5 F (36.9 C)  Min: 98.3 F (36.8 C)  Max: 98.7 F (37.1 C)  BP 109/64 BP  Min: 109/64  Max: 124/67  HR 76 Pulse  Avg: 84.7  Min: 76  Max: 93  RR 16 Resp  Avg: 16.7  Min: 16  Max: 18  SaO2 100 % Not Delivered SpO2  Avg: 99.3 %  Min: 98 %  Max: 100 %       24 Hour I/O Current Shift I/O  Time Ins Outs No intake/output data recorded. No intake/output data recorded.   Patient Vitals for the past 24 hrs:  BP Temp Temp src Pulse Resp SpO2  07/31/16 1433 109/64 98.3 F (36.8 C) - 76 16 -  07/31/16 1309 124/67 - - 93 16 100 %  07/31/16 1030 111/63 98.7 F (37.1 C) Oral 85 18 100 %  07/31/16 1025 - - - - - 98 %    There is no height or weight  on file to calculate BMI. General appearance: Well nourished, well developed female in no acute distress.  Neck:  Supple, normal appearance, and no thyromegaly  Cardiovascular: S1, S2 normal, no murmur, rub or gallop, regular rate and rhythm Respiratory:  Clear to auscultation bilateral. Normal respiratory effort Abdomen: +BS, soft, mild to moderately ttp Neuro/Psych:  Normal mood and affect.  Skin:  Warm and dry.   Laboratory: Recent Labs Lab 07/31/16 1055 07/31/16 1356  WBC 7.5  --   HGB 11.3* 11.6*  HCT 33.1* 34.0*  PLT 284  --     Recent Labs Lab 07/31/16 1055 07/31/16 1356  NA 136 140  K 3.3* 4.0  CL 106 104  CO2 25  --   BUN 10 9  CREATININE 0.83 0.90  CALCIUM 8.6*  --   PROT 6.6  --   BILITOT 0.8  --   ALKPHOS 64  --   ALT 12*  --   AST 20  --   GLUCOSE 102* 88   Imaging:  As above  Assessment: Phyllis Nichols is a 21 y.o. G2P0010 (Patient's last menstrual period was 06/23/2016.) with likely ectopic pregnancy. Pt currently stable  Plan: Admit to observation D/w her recommend diagnostic laparoscopy and likely will have to remove tube and/or ovary Recheck CBC and type and screen and can  proceed when OR is ready D/w pt that if everything goes well that she'll be able to be discharged home afterwards.   Cornelia Copaharlie Vaughn Beaumier, Jr. MD Attending Center for [facility](Faculty Practice)

## 2016-07-31 NOTE — Anesthesia Procedure Notes (Signed)
Procedure Name: Intubation Date/Time: 07/31/2016 4:21 PM Performed by: Flossie Dibble Pre-anesthesia Checklist: Patient identified, Emergency Drugs available, Suction available, Patient being monitored and Timeout performed Patient Re-evaluated:Patient Re-evaluated prior to inductionOxygen Delivery Method: Circle system utilized Preoxygenation: Pre-oxygenation with 100% oxygen Intubation Type: IV induction Ventilation: Mask ventilation without difficulty Laryngoscope Size: Mac and 3 Grade View: Grade I Tube type: Oral Tube size: 7.0 mm Number of attempts: 1 Airway Equipment and Method: Stylet Placement Confirmation: ETT inserted through vocal cords under direct vision,  positive ETCO2,  breath sounds checked- equal and bilateral and CO2 detector Secured at: 21 cm Dental Injury: Teeth and Oropharynx as per pre-operative assessment

## 2016-07-31 NOTE — ED Provider Notes (Signed)
[facility]-EMERGENCY DEPT Provider Note   CSN: [id number] Arrival date & time: 07/31/16  1016     History   Chief Complaint Chief Complaint  Patient presents with  . Abdominal Pain  . Vaginal Bleeding    HPI Phyllis Nichols is a 21 y.o. female.  Patient is a healthy 21 year old female presenting today with sudden onset of severe lower abdominal cramping and heavy vaginal bleeding. Patient states the pain is most significant on the left side and occasionally will radiate into the back. It causes nausea but no vomiting. She felt completely normal when she went to bed last night. Last menstrual period was 06/23/2016. She is currently sexually active with one partner and uses protection most of the time. She is denied any vaginal discharge itching or burning. She denies any urinary symptoms.   The history is provided by the patient.  Abdominal Pain   This is a new problem. The current episode started 3 to 5 hours ago. The problem occurs constantly. The problem has been gradually worsening. The pain is associated with an unknown factor. The pain is located in the suprapubic region (pelvic). The quality of the pain is cramping, shooting and colicky. The pain is at a severity of 10/10. The pain is severe. Associated symptoms include nausea and myalgias. Pertinent negatives include diarrhea, vomiting and dysuria. Associated symptoms comments: Heavy vaginal bleeding. The symptoms are aggravated by activity. Nothing relieves the symptoms. Past medical history comments: hx of painful menses in the past but never this bad.  Vaginal Bleeding  Primary symptoms include vaginal bleeding.  Primary symptoms include no dysuria. Associated symptoms include abdominal pain and nausea. Pertinent negatives include no diarrhea and no vomiting.    History reviewed. No pertinent past medical history.  There are no active problems to display for this patient.   Past Surgical History:  Procedure Laterality Date  .  WISDOM TOOTH EXTRACTION      OB History    No data available       Home Medications    Prior to Admission medications   Medication Sig Start Date End Date Taking? Authorizing Provider  ibuprofen (ADVIL,MOTRIN) 200 MG tablet Take 800 mg by mouth every 6 (six) hours as needed for cramping.   Yes Historical Provider, MD  naproxen (NAPROSYN) 500 MG tablet Take 500 mg by mouth 2 (two) times daily with a meal.   Yes Historical Provider, MD  doxycycline (VIBRAMYCIN) 100 MG capsule Take 1 capsule (100 mg total) by mouth 2 (two) times daily. Patient not taking: Reported on 07/31/2016 04/24/16   Hayden Rasmussenavid Mabe, NP  metroNIDAZOLE (FLAGYL) 500 MG tablet Take 1 tablet (500 mg total) by mouth 2 (two) times daily. X 7 days Patient not taking: Reported on 07/31/2016 04/24/16   Hayden Rasmussenavid Mabe, NP    Family History Family History  Problem Relation Age of Onset  . Adopted: Yes  . Cancer Mother     Social History Social History  Substance Use Topics  . Smoking status: Never Smoker  . Smokeless tobacco: Never Used  . Alcohol use Yes     Comment: occassional     Allergies   Patient has no known allergies.   Review of Systems Review of Systems  Gastrointestinal: Positive for abdominal pain and nausea. Negative for diarrhea and vomiting.  Genitourinary: Positive for vaginal bleeding. Negative for dysuria.  Musculoskeletal: Positive for myalgias.  All other systems reviewed and are negative.    Physical Exam Updated Vital Signs BP 111/63 (BP Location:  Left Arm)   Pulse 85   Temp 98.7 F (37.1 C) (Oral)   Resp 18   LMP 06/23/2016   SpO2 100%   Physical Exam  Constitutional: She is oriented to person, place, and time. She appears well-developed and well-nourished. She appears distressed.  Appears uncomfortable  HENT:  Head: Normocephalic and atraumatic.  Mouth/Throat: Oropharynx is clear and moist.  Eyes: Conjunctivae and EOM are normal. Pupils are equal, round, and reactive to light.    Neck: Normal range of motion. Neck supple.  Cardiovascular: Normal rate, regular rhythm and intact distal pulses.   No murmur heard. Pulmonary/Chest: Effort normal and breath sounds normal. No respiratory distress. She has no wheezes. She has no rales.  Abdominal: Soft. She exhibits no distension. There is tenderness. There is guarding. There is no rebound. No hernia.    Genitourinary: Uterus normal. Cervix exhibits no motion tenderness and no discharge. Right adnexum displays tenderness. Left adnexum displays tenderness. There is bleeding in the vagina.  Genitourinary Comments: Small amt of dark blood in vaginal vault.  No discharge.  Cervical os is closed  Musculoskeletal: Normal range of motion. She exhibits no edema or tenderness.  Neurological: She is alert and oriented to person, place, and time.  Skin: Skin is warm and dry. No rash noted. No erythema.  Psychiatric: She has a normal mood and affect. Her behavior is normal.  Nursing note and vitals reviewed.    ED Treatments / Results  Labs (all labs ordered are listed, but only abnormal results are displayed) Labs Reviewed  CBC - Abnormal; Notable for the following:       Result Value   RBC 3.85 (*)    Hemoglobin 11.3 (*)    HCT 33.1 (*)    All other components within normal limits  I-STAT BETA HCG BLOOD, ED ([facility], [facility], [facility] ONLY) - Abnormal; Notable for the following:    I-stat hCG, quantitative 550.1 (*)    All other components within normal limits  WET PREP, GENITAL  COMPREHENSIVE METABOLIC PANEL  URINALYSIS, ROUTINE W REFLEX MICROSCOPIC  GC/CHLAMYDIA PROBE AMP ([place]) NOT AT [facility]    EKG  EKG Interpretation None       Radiology US Ob Comp < 14 Wks  Result Date: 07/31/2016 CLINICAL DATA:  Left pelvic pain and bleeding for 1 day EXAM: OBSTETRIC <14 WK [place] AND TRANSVAGINAL OB US TECHNIQUE: Both transabdominal and transvaginal ultrasound examinations were performed for complete evaluation of the gestation as well  as the maternal uterus, adnexal regions, and pelvic cul-de-sac. Transvaginal technique was performed to assess early pregnancy. COMPARISON:  None. FINDINGS: Intrauterine gestational sac: None Yolk sac:  Not Visualized. Embryo:  Not Visualized. Cardiac Activity: Not Visualized. Maternal uterus/adnexae: Right ovary: Normal Left ovary: Normal Other :Within the left adnexum there is a 8 indeterminate complex mass measuring 6.3 x 4.5 x 4.3 cm Free fluid:  Moderate complex fluid is identified IMPRESSION: 1. No intrauterine gestational sac, yolk sac, or fetal pole identified. 2. There is a complex mass within the left adnexa which appears separate from the left ovary. There is also moderate complex fluid within the pelvis. Cannot rule out ectopic pregnancy. Critical Value/emergent results were called by telephone at the time of interpretation on 07/31/2016 at 12:38 pm to Dr. Gwyneth Sprout , who verbally acknowledged these results. Electronically Signed   By: Signa Kell M.D.   On: 07/31/2016 12:38   US Ob Transvaginal  Result Date: 07/31/2016 CLINICAL DATA:  Left pelvic pain and  bleeding for 1 day EXAM: OBSTETRIC <14 WK [place] AND TRANSVAGINAL OB US TECHNIQUE: Both transabdominal and transvaginal ultrasound examinations were performed for complete evaluation of the gestation as well as the maternal uterus, adnexal regions, and pelvic cul-de-sac. Transvaginal technique was performed to assess early pregnancy. COMPARISON:  None. FINDINGS: Intrauterine gestational sac: None Yolk sac:  Not Visualized. Embryo:  Not Visualized. Cardiac Activity: Not Visualized. Maternal uterus/adnexae: Right ovary: Normal Left ovary: Normal Other :Within the left adnexum there is a 8 indeterminate complex mass measuring 6.3 x 4.5 x 4.3 cm Free fluid:  Moderate complex fluid is identified IMPRESSION: 1. No intrauterine gestational sac, yolk sac, or fetal pole identified. 2. There is a complex mass within the left adnexa which appears  separate from the left ovary. There is also moderate complex fluid within the pelvis. Cannot rule out ectopic pregnancy. Critical Value/emergent results were called by telephone at the time of interpretation on 07/31/2016 at 12:38 pm to Dr. Gwyneth Sprout , who verbally acknowledged these results. Electronically Signed   By: Signa Kell M.D.   On: 07/31/2016 12:38    Procedures Procedures (including critical care time)  Medications Ordered in ED Medications  ketorolac (TORADOL) 15 MG/ML injection 15 mg (not administered)  ondansetron (ZOFRAN) injection 4 mg (not administered)     Initial Impression / Assessment and Plan / ED Course  I have reviewed the triage vital signs and the nursing notes.  Pertinent labs & imaging results that were available during my care of the patient were reviewed by me and considered in my medical decision making (see chart for details).    Patient is a 21 year old female presenting with acute severe lower abdominal cramping and vaginal bleeding today. Her menses is late this month and she does not use protection all the time. Concern for possible miscarriage versus menorrhagia. On exam patient does have significant adnexal tenderness worse on the left. Possible ruptured cyst versus ovarian torsion. Pregnancy test, labs, UA and GC chlamydia wet prep pending. Patient given Toradol and Zofran. Abdomen ultrasound to further evaluate.  11:29 AM HCg positive at 550 with is only mildly higher than a month ago at 440.  Most likely pt has ectopic or miscarriage.  Will get U/S to further eval.  1:27 PM Ultrasound concerning for left ectopic pregnancy with concern for potential rupture. She does have complex fluid in her pelvis. Spoke with OB/GYN who is accepting her in transfer to [facility] to the OR. Patient has been made nothing by mouth. Labs thus far stable. Patient's hemoglobin was 11. Currently patient is hemodynamically stable with a blood pressure 124/67 and a  heart rate of 93. Findings discussed with patient and she was transferred.  Final Clinical Impressions(s) / ED Diagnoses   Final diagnoses:  Vaginal bleeding  Ectopic pregnancy  Left tubal pregnancy without intrauterine pregnancy    New Prescriptions New Prescriptions   No medications on file     Gwyneth Sprout, MD 07/31/16 1328

## 2016-07-31 NOTE — MAU Note (Signed)
[facility] were told she was going directly to the OR.    Transfer from [facility], left ectopic preg.  Presented with abd pain that started this morning

## 2016-08-01 LAB — GC/CHLAMYDIA PROBE AMP (~~LOC~~) NOT AT ARMC
CHLAMYDIA, DNA PROBE: NEGATIVE
NEISSERIA GONORRHEA: NEGATIVE

## 2016-08-01 LAB — TYPE AND SCREEN
ABO/RH(D): O POS
Antibody Screen: NEGATIVE
Unit division: 0
Unit division: 0

## 2016-08-02 ENCOUNTER — Other Ambulatory Visit: Payer: Self-pay

## 2016-08-02 ENCOUNTER — Other Ambulatory Visit: Payer: Medicaid Other

## 2016-08-02 ENCOUNTER — Encounter (HOSPITAL_COMMUNITY): Payer: Self-pay | Admitting: Obstetrics and Gynecology

## 2016-08-02 DIAGNOSIS — O00109 Unspecified tubal pregnancy without intrauterine pregnancy: Secondary | ICD-10-CM

## 2016-08-03 LAB — HCG, QUANTITATIVE, PREGNANCY: HCG, BETA CHAIN, QUANT, S: 120.1 m[IU]/mL — AB

## 2016-08-06 ENCOUNTER — Encounter: Payer: Self-pay | Admitting: Obstetrics and Gynecology

## 2016-08-07 ENCOUNTER — Encounter (HOSPITAL_COMMUNITY): Payer: Self-pay

## 2016-08-07 ENCOUNTER — Inpatient Hospital Stay (HOSPITAL_COMMUNITY)
Admission: AD | Admit: 2016-08-07 | Discharge: 2016-08-07 | Disposition: A | Discharge status: Home / Self Care | Payer: Medicaid Other | Source: Ambulatory Visit | Attending: Family Medicine | Admitting: Family Medicine

## 2016-08-07 DIAGNOSIS — Z9889 Other specified postprocedural states: Secondary | ICD-10-CM | POA: Diagnosis not present

## 2016-08-07 DIAGNOSIS — N938 Other specified abnormal uterine and vaginal bleeding: Secondary | ICD-10-CM

## 2016-08-07 DIAGNOSIS — K5909 Other constipation: Secondary | ICD-10-CM

## 2016-08-07 DIAGNOSIS — F329 Major depressive disorder, single episode, unspecified: Secondary | ICD-10-CM | POA: Insufficient documentation

## 2016-08-07 DIAGNOSIS — N939 Abnormal uterine and vaginal bleeding, unspecified: Secondary | ICD-10-CM | POA: Diagnosis present

## 2016-08-07 DIAGNOSIS — K5900 Constipation, unspecified: Secondary | ICD-10-CM | POA: Diagnosis not present

## 2016-08-07 DIAGNOSIS — Z8759 Personal history of other complications of pregnancy, childbirth and the puerperium: Secondary | ICD-10-CM

## 2016-08-07 DIAGNOSIS — Z9079 Acquired absence of other genital organ(s): Secondary | ICD-10-CM | POA: Insufficient documentation

## 2016-08-07 LAB — CBC WITH DIFFERENTIAL/PLATELET
BASOS ABS: 0 10*3/uL (ref 0.0–0.1)
BASOS PCT: 1 %
EOS ABS: 0 10*3/uL (ref 0.0–0.7)
Eosinophils Relative: 1 %
HEMATOCRIT: 28.2 % — AB (ref 36.0–46.0)
HEMOGLOBIN: 9.8 g/dL — AB (ref 12.0–15.0)
Lymphocytes Relative: 33 %
Lymphs Abs: 2 10*3/uL (ref 0.7–4.0)
MCH: 29.8 pg (ref 26.0–34.0)
MCHC: 34.8 g/dL (ref 30.0–36.0)
MCV: 85.7 fL (ref 78.0–100.0)
Monocytes Absolute: 0.2 10*3/uL (ref 0.1–1.0)
Monocytes Relative: 3 %
NEUTROS ABS: 3.7 10*3/uL (ref 1.7–7.7)
NEUTROS PCT: 62 %
Platelets: 353 10*3/uL (ref 150–400)
RBC: 3.29 MIL/uL — ABNORMAL LOW (ref 3.87–5.11)
RDW: 12.9 % (ref 11.5–15.5)
WBC: 5.9 10*3/uL (ref 4.0–10.5)

## 2016-08-07 MED ORDER — FLEET ENEMA 7-19 GM/118ML RE ENEM: 1.0000 | ENEMA | Freq: Every day | RECTAL | 3 refills | Status: DC | PRN

## 2016-08-07 MED ORDER — BISACODYL 10 MG RE SUPP: 10.0000 mg | RECTAL | 1 refills | Status: DC | PRN

## 2016-08-07 MED ORDER — IBUPROFEN 800 MG PO TABS: 800.0000 mg | ORAL_TABLET | Freq: Three times a day (TID) | ORAL | 1 refills | Status: DC | PRN

## 2016-08-07 MED ORDER — MAGNESIUM CITRATE PO SOLN: 1.0000 | Freq: Once | ORAL | 2 refills | Status: AC

## 2016-08-07 NOTE — Discharge Instructions (Signed)
Possible remedy:  Mix one bottle of milk of magnesia with one bottle of Gatorade and drink.   Constipation, Adult Constipation is when a person has fewer bowel movements in a week than normal, has difficulty having a bowel movement, or has stools that are dry, hard, or larger than normal. Constipation may be caused by an underlying condition. It may become worse with age if a person takes certain medicines and does not take in enough fluids. Follow these instructions at home: Eating and drinking  Eat foods that have a lot of fiber, such as fresh fruits and vegetables, whole grains, and beans.  Limit foods that are high in fat, low in fiber, or overly processed, such as french fries, hamburgers, cookies, candies, and soda.  Drink enough fluid to keep your urine clear or pale yellow. General instructions  Exercise regularly or as told by your health care provider.  Go to the restroom when you have the urge to go. Do not hold it in.  Take over-the-counter and prescription medicines only as told by your health care provider. These include any fiber supplements.  Practice pelvic floor retraining exercises, such as deep breathing while relaxing the lower abdomen and pelvic floor relaxation during bowel movements.  Watch your condition for any changes.  Keep all follow-up visits as told by your health care provider. This is important. Contact a health care provider if:  You have pain that gets worse.  You have a fever.  You do not have a bowel movement after 4 days.  You vomit.  You are not hungry.  You lose weight.  You are bleeding from the anus.  You have thin, pencil-like stools. Get help right away if:  You have a fever and your symptoms suddenly get worse.  You leak stool or have blood in your stool.  Your abdomen is bloated.  You have severe pain in your abdomen.  You feel dizzy or you faint. This information is not intended to replace advice given to you by your  health care provider. Make sure you discuss any questions you have with your health care provider. Document Released: 03/16/2004 Document Revised: 01/06/2016 Document Reviewed: 12/07/2015 Elsevier Interactive Patient Education  2017 [employer]

## 2016-08-07 NOTE — MAU Provider Note (Signed)
[facility]ulty [facility] MAU Attending Note  History     CSN: [id number]  Arrival date & time 08/07/16  4098   First Provider Initiated Contact with Patient 08/07/16 2105      Chief Complaint  Patient presents with  . Vaginal Bleeding  . Constipation    Braylyn Colon is a 21 y.o. G2P0020 s/p recent laparoscopic left salpingectomy for ruptured ectopic pregnancy, who presents to MAU today for evaluation of heavy vaginal bleeding. She reports soaking one-two pads/hour all afternoon. No lightheadedness, dizziness or other symptoms.  Also she reports chronic constipation since prior to surgery; has tried oral medications that have not helped. Denies any abnormal vaginal discharge, fevers, chills, sweats, dysuria, nausea, vomiting, other GI or GU symptoms or other general symptoms.   Obstetric History   G2   P0   T0   P0   A2   L0    SAB0   TAB1   Ectopic1   Multiple0   Live Births0     # Outcome Date GA Lbr Len/2nd Weight Sex Delivery Anes PTL Lv  2 Ectopic 08/01/16 [redacted]w[redacted]d         1 TAB               Past Medical History:  Diagnosis Date  . Concussion   . Depression    doing geat right now  . Ectopic pregnancy 07/2016  . Infection    UTI  . Sexually transmissible disease     Past Surgical History:  Procedure Laterality Date  . DILATION AND CURETTAGE OF UTERUS     TAB  . LAPAROSCOPY Left 07/31/2016   Procedure: LAPAROSCOPY DIAGNOSTIC WITH LEFT  SALPINGECTOMY;  Surgeon: Terre Hill Bing, MD;  Location: [facility] ORS;  Service: Gynecology;  Laterality: Left;  Left sided ectopic    . WISDOM TOOTH EXTRACTION      Family History  Problem Relation Age of Onset  . Adopted: Yes  . Cancer Mother 82    lung  . Cancer Maternal Uncle     prostate  . Cancer Maternal Grandmother     breast    Social History  Substance Use Topics  . Smoking status: Never Smoker  . Smokeless tobacco: Never Used  . Alcohol use Yes     Comment: occassional    No Known Allergies  Prescriptions  Prior to Admission  Medication Sig Dispense Refill Last Dose  . naproxen (NAPROSYN) 250 MG tablet Take by mouth 2 (two) times daily with a meal.   Past Month at Unknown time  . oxyCODONE-acetaminophen (PERCOCET/ROXICET) 5-325 MG tablet Take 1-2 tablets by mouth every 6 (six) hours as needed. 10 tablet 0 08/06/2016 at 1700  . [DISCONTINUED] ibuprofen (ADVIL,MOTRIN) 200 MG tablet Take 800 mg by mouth every 6 (six) hours as needed for cramping.   08/07/2016 at 1815     Physical Exam  BP 118/71 (BP Location: Right Arm)   Pulse 69   Temp 98.1 F (36.7 C) (Oral)   Resp 18   Wt 131 lb 8 oz (59.6 kg)   LMP 06/15/2016   BMI 22.57 kg/m  GENERAL: Well-developed, well-nourished female in no acute distress  SKIN: Warm, dry and without erythema PSYCH: Normal mood and affect HEENT: Normocephalic, atraumatic.   LUNGS: Normal respiratory effort, normal breath sounds HEART: Regular rate noted ABDOMEN: Soft, nondistended, nontender, laparoscopic incisions healing well, Dermabond in place. PELVIC: Small amount of blood in vault, no active bleeding from cervix. EXTREMITIES: No edema, no cyanosis, normal range  of movement  MAU Course/MDM  CBC checked.  Labs and Imaging   CBC Latest Ref Rng & Units 08/07/2016 07/31/2016 07/31/2016  WBC 4.0 - 10.5 K/uL 5.9 9.5 -  Hemoglobin 12.0 - 15.0 g/dL 1.0(U9.8(L) 7.2(Z9.8(L) 11.6(L)  Hematocrit 36.0 - 46.0 % 28.2(L) 28.6(L) 34.0(L)  Platelets 150 - 400 K/uL 353 262 -    Assessment and Plan   1. S/P ectopic pregnancy   2. Vaginal bleeding   3. Constipation, unspecified constipation type     Reassured that amount of bleeding noted on exam is normal after recent ectopic.  Stable Hgb since 07/31/16.  Bleeding precautions reviewed. Recommended other constipation medications; especially suppositories or enema.  Was told to return to MAU for any worsening pain, bleeding or other concerns, or if her condition were to change or worsen. Discharged to home in stable  condition    Allergies as of 08/07/2016   No Known Allergies     Medication List    STOP taking these medications   naproxen 250 MG tablet Commonly known as:  NAPROSYN   oxyCODONE-acetaminophen 5-325 MG tablet Commonly known as:  PERCOCET/ROXICET     TAKE these medications   bisacodyl 10 MG suppository Commonly known as:  DULCOLAX Place 1 suppository (10 mg total) rectally as needed for moderate constipation.   ibuprofen 800 MG tablet Commonly known as:  ADVIL,MOTRIN Take 1 tablet (800 mg total) by mouth every 8 (eight) hours as needed for cramping. What changed:  medication strength  when to take this   magnesium citrate Soln Take 296 mLs (1 Bottle total) by mouth once.   sodium phosphate 7-19 GM/118ML Enem Place 133 mLs (1 enema total) rectally daily as needed for severe constipation.        Phyllis CollinsUGONNA  Murray Durrell, MD, FACOG Attending Obstetrician & Gynecologist, [facility] for [facility], [facility]

## 2016-08-07 NOTE — MAU Note (Addendum)
Pt c/o vag bleeding and lower abdominal cramping that started yesterday. Rates 8/10. Took 800mg  ibuprofen around 6:15pm-has not helped. Had surgery for ectopic pregnancy on 1/30. Pt states that she has not had a BM since before her surgery. Has tried stool softeners, smooth move tea, magnesium citrate-nothing has helped.

## 2016-08-07 NOTE — MAU Note (Addendum)
PT  HAD  SURGERY ON 1-30-     HAD  ECTOPIC   .    BLEEDING  BECAME  HEAVY YESTERDAY-   WITH  BLOOD  CLOTS-    PAD   IN TRIAGE - SMALL AMT   DARK .   SAYS HAS  SATURATED  3  PADS  TODAY.        FEELS MODERATE   CRAMPING   TOOK   4  TABS  IBUPROFEN    AT    615PM.   -  NO RELIEF

## 2016-08-08 LAB — URINALYSIS, DIPSTICK ONLY
GLUCOSE, UA: 100 mg/dL — AB
KETONES UR: NEGATIVE mg/dL
Leukocytes, UA: NEGATIVE
Nitrite: NEGATIVE
PROTEIN: 100 mg/dL — AB
Specific Gravity, Urine: >1.03 — ABNORMAL HIGH (ref 1.005–1.030)
pH: 5.5 (ref 5.0–8.0)

## 2016-08-13 NOTE — Anesthesia Postprocedure Evaluation (Signed)
Anesthesia Post Note  Patient: Phyllis Nichols  Procedure(s) Performed: Procedure(s) (LRB): LAPAROSCOPY DIAGNOSTIC WITH LEFT  SALPINGECTOMY (Left)  Patient location during evaluation: PACU Anesthesia Type: General Level of consciousness: sedated Pain management: satisfactory to patient Vital Signs Assessment: post-procedure vital signs reviewed and stable Respiratory status: spontaneous breathing Cardiovascular status: stable Anesthetic complications: no       Last Vitals:  Vitals:   07/31/16 1930 07/31/16 2014  BP: (!) 104/58 (!) 120/48  Pulse: 90 84  Resp: 12 14  Temp:  36.8 C    Last Pain:  Vitals:   08/02/16 1111  TempSrc:   PainSc: 6                  Braison Snoke EDWARD

## 2016-08-21 ENCOUNTER — Encounter: Payer: Self-pay | Admitting: Obstetrics and Gynecology

## 2016-08-21 ENCOUNTER — Ambulatory Visit (INDEPENDENT_AMBULATORY_CARE_PROVIDER_SITE_OTHER): Payer: Self-pay | Admitting: Obstetrics and Gynecology

## 2016-08-21 VITALS — BP 120/65 | HR 67 | Wt 129.1 lb

## 2016-08-21 DIAGNOSIS — O00109 Unspecified tubal pregnancy without intrauterine pregnancy: Secondary | ICD-10-CM

## 2016-08-21 DIAGNOSIS — N7011 Chronic salpingitis: Secondary | ICD-10-CM

## 2016-08-21 NOTE — Progress Notes (Signed)
Obstetrics and Gynecology Visit Post-Op Patient Evaluation  Appointment Date: 08/21/2016  Primary Care Provider: No PCP Per Patient  Chief Complaint: regular post op visit  History of Present Illness:  Phyllis Nichols is a 21 y.o. G2P0020 who is s/p 1/30 l/s left salpingectomy for ectopic pregnancy. PMHx significant for remote h/o CT and abnormal right fallopian tube. Patient discharged to home from the PACU  Interval History: Since that time, she states that she's doing well and no sequelae or post operative issues. No period yet.   Review of Systems:  as noted in the History of Present Illness.  Medications: None Allergies: has No Known Allergies.  Physical Exam:  BP 120/65   Pulse 67   Wt 129 lb 1.6 oz (58.6 kg)   BMI 22.16 kg/m  Body mass index is 22.16 kg/m. General appearance: Well nourished, well developed female in no acute distress.  Abdomen: diffusely non tender to palpation, non distended, and no masses, hernias. Well healed l/s port sites Neuro/Psych:  Normal mood and affect.    Assessment: patient doing well at post op visit  Plan:  1. Tubal pregnancy without intrauterine pregnancy, unspecified laterality Since ?rupture prior to surgery will repeat beta quant to ensure it goes to zero. Patient told if no period by end of march to call us and let us know. [facility] at this time. - Beta HCG, Quant  2. Right Hydrosalpinx Long d/w pt re: surgical findings with the abnormal right tube. I told her that she has four main issues: 1) inability to get pregnant in the future, 2) increased risk of ectopic pregnancy if she got pregnant again, 3) increased risk of SAB given the hydro, and 4) increased risk of PID/TOA with the abnormal tube in place.  Copies of the OR pics were given to her for her records. I told her that I'd recommend her seeing REI now, to get their opinion on management of the tube now and in the future, when she is interested  in getting pregnant again, which she states is likely in the far future. Patient amenable to seeing REI.  - Ambulatory referral to Infertility  RTC: PRN  Cornelia Copaharlie Pariss Hommes, Jr MD Attending Center for [facility](Faculty Practice)

## 2016-08-22 LAB — BETA HCG QUANT (REF LAB)

## 2016-09-05 ENCOUNTER — Encounter: Payer: Self-pay | Admitting: Obstetrics and Gynecology

## 2016-09-05 DIAGNOSIS — Z30011 Encounter for initial prescription of contraceptive pills: Secondary | ICD-10-CM

## 2016-09-05 MED ORDER — NORGESTIMATE-ETH ESTRADIOL 0.25-35 MG-MCG PO TABS: 1.0000 | ORAL_TABLET | Freq: Every day | ORAL | 11 refills | Status: DC

## 2017-01-20 ENCOUNTER — Encounter: Payer: Self-pay | Admitting: Obstetrics and Gynecology

## 2017-01-21 ENCOUNTER — Other Ambulatory Visit: Payer: Self-pay | Admitting: General Practice

## 2017-01-21 DIAGNOSIS — Z202 Contact with and (suspected) exposure to infections with a predominantly sexual mode of transmission: Secondary | ICD-10-CM

## 2017-01-21 MED ORDER — METRONIDAZOLE 500 MG PO TABS: 2000.0000 mg | ORAL_TABLET | Freq: Once | ORAL | 0 refills | Status: AC

## 2017-02-04 ENCOUNTER — Ambulatory Visit: Payer: PRIVATE HEALTH INSURANCE | Admitting: Obstetrics and Gynecology

## 2017-02-04 ENCOUNTER — Encounter: Payer: Self-pay | Admitting: Obstetrics and Gynecology

## 2017-02-04 NOTE — Progress Notes (Signed)
Patient did not keep GYN appointment for 02/04/2017.  Ethel Veronica, Jr MD Attending Center for [facility] (Faculty Practice)   

## 2017-02-06 ENCOUNTER — Ambulatory Visit (HOSPITAL_COMMUNITY)
Admission: EM | Admit: 2017-02-06 | Discharge: 2017-02-06 | Disposition: A | Discharge status: Home / Self Care | Payer: PRIVATE HEALTH INSURANCE | Attending: Internal Medicine | Admitting: Internal Medicine

## 2017-02-06 ENCOUNTER — Encounter (HOSPITAL_COMMUNITY): Payer: Self-pay | Admitting: Family Medicine

## 2017-02-06 DIAGNOSIS — N730 Acute parametritis and pelvic cellulitis: Secondary | ICD-10-CM | POA: Diagnosis not present

## 2017-02-06 DIAGNOSIS — R102 Pelvic and perineal pain: Secondary | ICD-10-CM

## 2017-02-06 DIAGNOSIS — N898 Other specified noninflammatory disorders of vagina: Secondary | ICD-10-CM

## 2017-02-06 LAB — POCT URINALYSIS DIP (DEVICE)
BILIRUBIN URINE: NEGATIVE
Glucose, UA: NEGATIVE mg/dL
HGB URINE DIPSTICK: NEGATIVE
Ketones, ur: NEGATIVE mg/dL
LEUKOCYTES UA: NEGATIVE
Nitrite: NEGATIVE
Protein, ur: 100 mg/dL — AB
SPECIFIC GRAVITY, URINE: 1.015 (ref 1.005–1.030)
Urobilinogen, UA: 0.2 mg/dL (ref 0.0–1.0)
pH: 8.5 — ABNORMAL HIGH (ref 5.0–8.0)

## 2017-02-06 LAB — POCT PREGNANCY, URINE: PREG TEST UR: NEGATIVE

## 2017-02-06 MED ORDER — CEFTRIAXONE SODIUM 250 MG IJ SOLR
INTRAMUSCULAR | Status: AC
  Filled 2017-02-06: qty 250

## 2017-02-06 MED ORDER — METRONIDAZOLE 500 MG PO TABS: 500.0000 mg | ORAL_TABLET | Freq: Two times a day (BID) | ORAL | 0 refills | Status: DC

## 2017-02-06 MED ORDER — DOXYCYCLINE HYCLATE 100 MG PO CAPS: 100.0000 mg | ORAL_CAPSULE | Freq: Two times a day (BID) | ORAL | 0 refills | Status: DC

## 2017-02-06 MED ORDER — IBUPROFEN 800 MG PO TABS: 800.0000 mg | ORAL_TABLET | Freq: Three times a day (TID) | ORAL | 0 refills | Status: DC | PRN

## 2017-02-06 MED ORDER — CEFTRIAXONE SODIUM 250 MG IJ SOLR
250.0000 mg | Freq: Once | INTRAMUSCULAR | Status: AC
  Administered 2017-02-06: 250 mg via INTRAMUSCULAR

## 2017-02-06 NOTE — ED Triage Notes (Signed)
Pt here for abd pain and "pain in bladder". Denies any urinary symptoms. Denies vaginal bleeding or discharge.

## 2017-02-06 NOTE — ED Provider Notes (Signed)
  [facility]   [id number] 02/06/17 Arrival Time: 1007  ASSESSMENT & PLAN:  1. PID (acute pelvic inflammatory disease)   2. Pelvic pain in female   3. Vaginal discharge     Meds ordered this encounter  Medications  . cefTRIAXone (ROCEPHIN) injection 250 mg  . doxycycline (VIBRAMYCIN) 100 MG capsule    Sig: Take 1 capsule (100 mg total) by mouth 2 (two) times daily.    Dispense:  28 capsule    Refill:  0    Order Specific Question:   Supervising Provider    Answer:   Mardella LaymanHAGLER, BRIAN [id number]]  . metroNIDAZOLE (FLAGYL) 500 MG tablet    Sig: Take 1 tablet (500 mg total) by mouth 2 (two) times daily.    Dispense:  28 tablet    Refill:  0    Order Specific Question:   Supervising Provider    Answer:   Mardella LaymanHAGLER, BRIAN [id number]]  . ibuprofen (ADVIL,MOTRIN) 800 MG tablet    Sig: Take 1 tablet (800 mg total) by mouth every 8 (eight) hours as needed.    Dispense:  21 tablet    Refill:  0    Order Specific Question:   Supervising Provider    Answer:   Mardella LaymanHAGLER, BRIAN [[id number]]   Endocervical Cytology - GC Chlamydia Trich bvag cvag   Discussed safe sex  Return Precautions explained  Follow up with OBGYN appointment scheduled in next few weeks.  Reviewed expectations re: course of current medical issues. Questions answered. Outlined signs and symptoms indicating need for more acute intervention. Patient verbalized understanding. After Visit Summary given.   SUBJECTIVE:  Phyllis Nichols is a 21 y.o. female who presents with complaint of pelvic pain and vaginal discharge.  She states she is sexually active And does not use protection.  She voices concern about a possible STD.  ROS: As per HPI.   OBJECTIVE:  Vitals:   02/06/17 1035  BP: 137/67  Pulse: 75  Resp: 18  Temp: 98 F (36.7 C)  SpO2: 100%     General appearance: alert; no distress HEENT: normocephalic; atraumatic; conjunctivae normal; TMs normal Neck: supple Lungs: clear to auscultation  bilaterally Heart: regular rate and rhythm Abdomen: soft, non-tender; bowel sounds normal; no masses or organomegaly; no guarding or rebound tenderness Back: no CVA tenderness Extremities: no cyanosis or edema; symmetrical with no gross deformities GU - BUS wnl, Vagina - white vaginal DC Cervix - positive cmt and right adnexal tenderness. Skin: warm and dry Neurologic: normal symmetric reflexes; normal gait Psychological:  alert and cooperative; normal mood and affect    Labs Reviewed  POCT URINALYSIS DIP (DEVICE) - Abnormal; Notable for the following:       Result Value   pH 8.5 (*)    Protein, ur 100 (*)    All other components within normal limits  POCT PREGNANCY, URINE  CERVICOVAGINAL ANCILLARY ONLY    No results found.  No Known Allergies  PMHx, SurgHx, SocialHx, Medications, and Allergies were reviewed in the Visit Navigator and updated as appropriate.      Deatra CanterOxford, William J, FNP 02/06/17 1125

## 2017-02-06 NOTE — Discharge Instructions (Signed)
Follow up with OBGYN with appointment.  Take Motrin for pain

## 2017-02-07 LAB — CERVICOVAGINAL ANCILLARY ONLY
Bacterial vaginitis: POSITIVE — AB
Candida vaginitis: NEGATIVE
Chlamydia: NEGATIVE
Neisseria Gonorrhea: NEGATIVE
Trichomonas: POSITIVE — AB

## 2017-02-26 ENCOUNTER — Ambulatory Visit: Payer: PRIVATE HEALTH INSURANCE | Admitting: Certified Nurse Midwife

## 2017-02-26 ENCOUNTER — Encounter: Payer: Self-pay | Admitting: Certified Nurse Midwife

## 2017-07-18 ENCOUNTER — Encounter: Payer: Self-pay | Admitting: Obstetrics and Gynecology

## 2017-07-18 ENCOUNTER — Ambulatory Visit (INDEPENDENT_AMBULATORY_CARE_PROVIDER_SITE_OTHER): Payer: Medicaid Other | Admitting: Obstetrics and Gynecology

## 2017-07-18 ENCOUNTER — Other Ambulatory Visit (HOSPITAL_COMMUNITY)
Admission: RE | Admit: 2017-07-18 | Discharge: 2017-07-18 | Disposition: A | Discharge status: Home / Self Care | Payer: Medicaid Other | Source: Ambulatory Visit | Attending: Obstetrics and Gynecology | Admitting: Obstetrics and Gynecology

## 2017-07-18 VITALS — BP 118/62 | HR 66 | Wt 147.7 lb

## 2017-07-18 DIAGNOSIS — Z309 Encounter for contraceptive management, unspecified: Secondary | ICD-10-CM

## 2017-07-18 DIAGNOSIS — Z01419 Encounter for gynecological examination (general) (routine) without abnormal findings: Secondary | ICD-10-CM | POA: Insufficient documentation

## 2017-07-18 DIAGNOSIS — Z801 Family history of malignant neoplasm of trachea, bronchus and lung: Secondary | ICD-10-CM | POA: Insufficient documentation

## 2017-07-18 MED ORDER — FOLIC ACID 1 MG PO TABS: 1.0000 mg | ORAL_TABLET | Freq: Every day | ORAL | 10 refills | Status: DC

## 2017-07-18 NOTE — Patient Instructions (Signed)
[facility] DR. Fermin SchwabAMER YALCINKAYA  [email or web address]

## 2017-07-18 NOTE — Progress Notes (Signed)
Obstetrics and Gynecology Annual Patient Evaluation  Appointment Date: 07/18/2017  OB[facility]  Primary Care Provider: Patient, No Pcp Per  Chief Complaint:  Chief Complaint  Patient presents with  . Gynecologic Exam    History of Present Illness: Phyllis Nichols is a 22 y.o. African-American G2P0020 (Patient's last menstrual period was 07/04/2017 (exact date).), seen for the above chief complaint.   She has questions re: future fertility. She is still with her partner and likely future fiance; he is the same partner that she got pregnant with and had a subsequent ectopic pregnancy back in 07/2016. No pregnancies since then. She was dx with trich later that year when she went to urgent care with abdominal pain. She was dx with pid and given rocephin and doxycycline and flagyl.   No breast s/s, fevers, chills, chest pain, SOB, nausea, vomiting, abdominal pain, dysuria, hematuria, vaginal itching, dyspareunia, diarrhea, constipation, blood in BMs  Review of Systems:  as noted in the History of Present Illness.  Past Medical History:  Past Medical History:  Diagnosis Date  . Concussion   . Depression    doing geat right now  . Ectopic pregnancy 07/2016  . Infection    UTI  . Sexually transmissible disease     Past Surgical History:  Past Surgical History:  Procedure Laterality Date  . DILATION AND CURETTAGE OF UTERUS     TAB  . LAPAROSCOPY Left 07/31/2016   Procedure: LAPAROSCOPY DIAGNOSTIC WITH LEFT  SALPINGECTOMY;  Surgeon: [place] Bingharlie Lerry Cordrey, MD;  Location: [facility];  Service: Gynecology;  Laterality: Left;  Left sided ectopic    . WISDOM TOOTH EXTRACTION      Past Obstetrical History:  OB History  Gravida Para Term Preterm AB Living  2       2    SAB TAB Ectopic Multiple Live Births    1 1        # Outcome Date GA Lbr Len/2nd Weight Sex Delivery Anes PTL Lv  2 Ectopic 08/01/16 [id number]         1 TAB                 Past Gynecological History: As per HPI. Periods: qmonth, regular, no AUB, 3days and somewhat heavy and painful History of Pap Smear(s): unknown History of STI(s): Yes.   She is currently using nothing for contraception.   Social History:  Social History   Socioeconomic History  . Marital status: Single    Spouse name: Not on file  . Number of children: Not on file  . Years of education: Not on file  . Highest education level: Not on file  Social Needs  . Financial resource strain: Not on file  . Food insecurity - worry: Not on file  . Food insecurity - inability: Not on file  . Transportation needs - medical: Not on file  . Transportation needs - non-medical: Not on file  Occupational History  . Not on file  Tobacco Use  . Smoking status: Never Smoker  . Smokeless tobacco: Never Used  Substance and Sexual Activity  . Alcohol use: Yes    Comment: occassional  . Drug use: No  . Sexual activity: Not Currently    Birth control/protection: Condom  Other Topics Concern  . Not on file  Social History Narrative  . Not on file  Patient vapes  Family History:  Family History  Adopted: Yes  Problem Relation Age of Onset  .  Cancer Mother 63       lung  . Cancer Maternal Uncle        prostate  . Cancer Maternal Grandmother        breast    Medications None  Allergies Patient has no known allergies.   Physical Exam:  BP 118/62   Pulse 66   Wt 147 lb 11.2 oz (67 kg)   LMP 07/04/2017 (Exact Date)   BMI 25.35 kg/m  Body mass index is 25.35 kg/m. General appearance: Well nourished, well developed female in no acute distress.  Neck:  Supple, normal appearance, and no thyromegaly  Cardiovascular: normal s1 and s2.  No murmurs, rubs or gallops. Respiratory:  Clear to auscultation bilateral. Normal respiratory effort Abdomen: positive bowel sounds and no masses, hernias; diffusely non tender to palpation, non distended Neuro/Psych:  Normal mood and affect.  Skin:   Warm and dry.  Lymphatic:  No inguinal lymphadenopathy.   Pelvic exam: is not limited by body habitus EGBUS: within normal limits, Vagina: within normal limits and with no blood  in the vault, +slight white non malodorous d/c in vault, Cervix: normal appearing cervix without tenderness, discharge or lesions. Uterus:  nonenlarged and non tender and Adnexa:  normal adnexa and no mass, fullness, tenderness Rectovaginal: deferred  Laboratory: none  Radiology: none  Assessment: pt stable  Plan:  1. Women's annual routine gynecological examination Pt amenable to STI swab but declines serum testing. She wants to get pregnant again and d/w her re: surgical findings and recommend REF consult with [facility]. Patient told to call and ask for cash price for a consult if insurance doesn't cover and to bring her partner; he has one child already, she states - Cytology - PAP  RTC PRN  Cornelia Copa MD Attending Center for [facility])

## 2017-07-19 LAB — CYTOLOGY - PAP
BACTERIAL VAGINITIS: POSITIVE — AB
Candida vaginitis: POSITIVE — AB
Chlamydia: NEGATIVE
Diagnosis: NEGATIVE
Neisseria Gonorrhea: NEGATIVE
Trichomonas: NEGATIVE

## 2017-07-22 ENCOUNTER — Encounter (INDEPENDENT_AMBULATORY_CARE_PROVIDER_SITE_OTHER): Payer: Self-pay

## 2017-07-22 ENCOUNTER — Other Ambulatory Visit: Payer: Self-pay | Admitting: Obstetrics and Gynecology

## 2017-07-22 MED ORDER — MICONAZOLE NITRATE 2 % VA CREA: 1.0000 | TOPICAL_CREAM | Freq: Every day | VAGINAL | 2 refills | Status: DC

## 2017-07-22 MED ORDER — METRONIDAZOLE 500 MG PO TABS: 500.0000 mg | ORAL_TABLET | Freq: Two times a day (BID) | ORAL | 0 refills | Status: AC

## 2017-10-07 ENCOUNTER — Ambulatory Visit (HOSPITAL_COMMUNITY)
Admission: EM | Admit: 2017-10-07 | Discharge: 2017-10-07 | Disposition: A | Discharge status: Home / Self Care | Payer: Medicaid Other | Attending: Family Medicine | Admitting: Family Medicine

## 2017-10-07 ENCOUNTER — Encounter (HOSPITAL_COMMUNITY): Payer: Self-pay | Admitting: Emergency Medicine

## 2017-10-07 DIAGNOSIS — Z7289 Other problems related to lifestyle: Secondary | ICD-10-CM | POA: Insufficient documentation

## 2017-10-07 DIAGNOSIS — R102 Pelvic and perineal pain: Secondary | ICD-10-CM | POA: Insufficient documentation

## 2017-10-07 DIAGNOSIS — F1099 Alcohol use, unspecified with unspecified alcohol-induced disorder: Secondary | ICD-10-CM

## 2017-10-07 DIAGNOSIS — R300 Dysuria: Secondary | ICD-10-CM | POA: Insufficient documentation

## 2017-10-07 DIAGNOSIS — Z789 Other specified health status: Secondary | ICD-10-CM

## 2017-10-07 DIAGNOSIS — Z3202 Encounter for pregnancy test, result negative: Secondary | ICD-10-CM

## 2017-10-07 DIAGNOSIS — F329 Major depressive disorder, single episode, unspecified: Secondary | ICD-10-CM | POA: Insufficient documentation

## 2017-10-07 LAB — POCT URINALYSIS DIP (DEVICE)
Bilirubin Urine: NEGATIVE
GLUCOSE, UA: NEGATIVE mg/dL
Hgb urine dipstick: NEGATIVE
KETONES UR: NEGATIVE mg/dL
Leukocytes, UA: NEGATIVE
Nitrite: NEGATIVE
PH: 6 (ref 5.0–8.0)
PROTEIN: NEGATIVE mg/dL
SPECIFIC GRAVITY, URINE: 1.015 (ref 1.005–1.030)
Urobilinogen, UA: 0.2 mg/dL (ref 0.0–1.0)

## 2017-10-07 LAB — POCT PREGNANCY, URINE: Preg Test, Ur: NEGATIVE

## 2017-10-07 NOTE — ED Provider Notes (Signed)
  MRN: [id number] DOB: 05/05/1996  Subjective:   Phyllis Nichols is a 22 y.o. female presenting for 1 day history of dysuria and pelvic pain. Pain is intermittent, worse with a lot of movement. LMP was 04/02-11/2017, was regular. Has not tried any medications for relief. Used ibuprofen for her menstrual cramps. Denies fever, hematuria, urinary frequency, urinary urgency, flank pain, abdominal pain, cloudy malordorous urine, genital rash and vaginal discharge, nausea and vomiting. Patient admits heavy alcohol use between 04/03-11/2017, [place]. Drinks 1/2 gallon of water. Denies coffee. Denies smoking cigarettes.   No current facility-administered medications for this encounter.   Current Outpatient Medications:  .  ibuprofen (ADVIL,MOTRIN) 800 MG tablet, Take 1 tablet (800 mg total) by mouth every 8 (eight) hours as needed. (Patient not taking: Reported on 07/18/2017), Disp: 21 tablet, Rfl: 0    No Known Allergies   Past Medical History:  Diagnosis Date  . Concussion   . Depression    doing geat right now  . Ectopic pregnancy 07/2016  . Infection    UTI  . Sexually transmissible disease      Past Surgical History:  Procedure Laterality Date  . DILATION AND CURETTAGE OF UTERUS     TAB  . LAPAROSCOPY Left 07/31/2016   Procedure: LAPAROSCOPY DIAGNOSTIC WITH LEFT  SALPINGECTOMY;  Surgeon: Sigour[place] Bingharlie Pickens, MD;  Location: [facility];  Service: Gynecology;  Laterality: Left;  Left sided ectopic    . WISDOM TOOTH EXTRACTION      Objective:   Vitals: BP 121/75 (BP Location: Left Arm)   Pulse 84   Temp 98.2 F (36.8 C) (Oral)   Resp 18   SpO2 100%   Physical Exam  Constitutional: She is oriented to person, place, and time. She appears well-developed and well-nourished.  HENT:  Mouth/Throat: Oropharynx is clear and moist.  Cardiovascular: Normal rate, regular rhythm and intact distal pulses. Exam reveals no gallop and no friction rub.  No murmur  heard. Pulmonary/Chest: No respiratory distress. She has no wheezes. She has no rales.  Abdominal: Soft. Bowel sounds are normal. She exhibits no distension and no mass. There is no tenderness. There is no rebound, no guarding and no CVA tenderness.  Neurological: She is alert and oriented to person, place, and time.  Skin: Skin is warm and dry.  Psychiatric: She has a normal mood and affect.   Results for orders placed or performed during the hospital encounter of 10/07/17 (from the past 24 hour(s))  POCT urinalysis dip (device)     Status: None   Collection Time: 10/07/17  1:52 PM  Result Value Ref Range   Glucose, UA NEGATIVE NEGATIVE mg/dL   Bilirubin Urine NEGATIVE NEGATIVE   Ketones, ur NEGATIVE NEGATIVE mg/dL   Specific Gravity, Urine 1.015 1.005 - 1.030   Hgb urine dipstick NEGATIVE NEGATIVE   pH 6.0 5.0 - 8.0   Protein, ur NEGATIVE NEGATIVE mg/dL   Urobilinogen, UA 0.2 0.0 - 1.0 mg/dL   Nitrite NEGATIVE NEGATIVE   Leukocytes, UA NEGATIVE NEGATIVE  Pregnancy, urine POC     Status: None   Collection Time: 10/07/17  2:09 PM  Result Value Ref Range   Preg Test, Ur NEGATIVE NEGATIVE   Assessment and Plan :   Dysuria  Pelvic pain  Alcohol use  Discussed avoiding alcohol use and hydrating better. Will check for STIs, urine culture pending. Return-to-clinic precautions discussed, patient verbalized understanding.    Wallis BambergMani, Millicent Blazejewski, [place]-C 10/07/17 1442

## 2017-10-07 NOTE — ED Triage Notes (Signed)
Pt sts UTI sx

## 2017-10-07 NOTE — Discharge Instructions (Signed)
Hydrate well with at least 2 liters (1 gallon) of water daily.  °

## 2017-10-08 LAB — URINE CYTOLOGY ANCILLARY ONLY
CHLAMYDIA, DNA PROBE: NEGATIVE
NEISSERIA GONORRHEA: NEGATIVE
TRICH (WINDOWPATH): NEGATIVE

## 2017-10-08 LAB — URINE CULTURE

## 2017-10-09 ENCOUNTER — Encounter: Payer: Self-pay | Admitting: Obstetrics and Gynecology

## 2017-10-09 ENCOUNTER — Telehealth (HOSPITAL_COMMUNITY): Payer: Self-pay

## 2017-10-09 NOTE — Telephone Encounter (Signed)
Attempted to contact patient regarding normal results. No answer at this time. 

## 2017-10-11 ENCOUNTER — Other Ambulatory Visit (HOSPITAL_COMMUNITY)
Admission: RE | Admit: 2017-10-11 | Discharge: 2017-10-11 | Disposition: A | Discharge status: Home / Self Care | Payer: Medicaid Other | Source: Ambulatory Visit | Attending: Family Medicine | Admitting: Family Medicine

## 2017-10-11 ENCOUNTER — Ambulatory Visit (INDEPENDENT_AMBULATORY_CARE_PROVIDER_SITE_OTHER): Payer: Self-pay | Admitting: *Deleted

## 2017-10-11 DIAGNOSIS — R300 Dysuria: Secondary | ICD-10-CM

## 2017-10-11 DIAGNOSIS — N898 Other specified noninflammatory disorders of vagina: Secondary | ICD-10-CM | POA: Insufficient documentation

## 2017-10-11 NOTE — Progress Notes (Signed)
Pt states she no longer has concern about the brown discharge - feels it was from her period which started on 4/2. She now has large amount of vaginal discharge without odor or irritation. She also states when she urinates, she has pain. Per chart review, pt seen @ ED on 4/8 and had negative testing for GC/Chlamydia and Trich. Testing for Cvag and Bvag was not performed. Urine culture requires re-collection due to multiple species present previously. CC urine collected and sent to lab. Self swab obtained for Cvag and Bvag. Pt requests Rx for Tindamax instead of Flagyl if needed for +BV. She also stated that she routinely gets vaginal yeast after BV treatment and requests Rx for that as well based on test results. Pt will be notified via MyChart.  She voiced understanding of all information and instructions given.

## 2017-10-13 LAB — URINE CULTURE

## 2017-10-14 ENCOUNTER — Other Ambulatory Visit: Payer: Self-pay | Admitting: Nurse Practitioner

## 2017-10-14 LAB — CERVICOVAGINAL ANCILLARY ONLY
Bacterial vaginitis: POSITIVE — AB
CANDIDA VAGINITIS: NEGATIVE

## 2017-10-14 MED ORDER — TINIDAZOLE 500 MG PO TABS: 2.0000 g | ORAL_TABLET | Freq: Every day | ORAL | 0 refills | Status: AC

## 2017-10-14 MED ORDER — FLUCONAZOLE 150 MG PO TABS: 150.0000 mg | ORAL_TABLET | Freq: Every day | ORAL | 0 refills | Status: AC

## 2017-10-14 NOTE — Progress Notes (Signed)
Lab results reviewed and medication sent to her pharmacy for BV.  Tindamax 2g PO daily x 2 days and Diflucan 150 mg po once to follow Tindamax if needed for yeast infection.  See nurses note from 10-11-17.  Sent MyChart message to client.  Nolene BernheimERRI Mackey Varricchio, RN, MSN, NP-BC Nurse Practitioner, [facility], [facility] 10/14/2017 4:55 PM

## 2017-10-14 NOTE — Progress Notes (Signed)
I agree with the plan of care and documentation on this visit. Nolene BernheimERRI BURLESON, RN, MSN, NP-BC Nurse Practitioner, [facility], [facility] 10/14/2017 4:47 PM

## 2017-12-12 ENCOUNTER — Encounter: Payer: Self-pay | Admitting: Obstetrics and Gynecology

## 2017-12-17 ENCOUNTER — Ambulatory Visit: Payer: Medicaid Other

## 2017-12-19 ENCOUNTER — Ambulatory Visit (INDEPENDENT_AMBULATORY_CARE_PROVIDER_SITE_OTHER): Payer: Medicaid Other | Admitting: *Deleted

## 2017-12-19 ENCOUNTER — Other Ambulatory Visit (HOSPITAL_COMMUNITY)
Admission: RE | Admit: 2017-12-19 | Discharge: 2017-12-19 | Disposition: A | Discharge status: Home / Self Care | Payer: Medicaid Other | Source: Ambulatory Visit | Attending: Family Medicine | Admitting: Family Medicine

## 2017-12-19 VITALS — BP 129/58 | HR 69 | Wt 149.6 lb

## 2017-12-19 DIAGNOSIS — Z7251 High risk heterosexual behavior: Secondary | ICD-10-CM

## 2017-12-19 NOTE — Progress Notes (Signed)
Pt states she has had unprotected sex 3 weeks ago and would like to be tested for STI's.  She denies having vaginal discharge or pelvic pain. Self vaginal swab performed, labs drawn.  Pt will be notified of results via MyChart.  Pt voiced understanding of instructions and information given.

## 2017-12-19 NOTE — Progress Notes (Signed)
Patient seen and assessed by nursing staff.  Agree with documentation and plan.  

## 2017-12-20 LAB — CERVICOVAGINAL ANCILLARY ONLY
CHLAMYDIA, DNA PROBE: POSITIVE — AB
NEISSERIA GONORRHEA: NEGATIVE
TRICH (WINDOWPATH): NEGATIVE

## 2017-12-20 MED ORDER — AZITHROMYCIN 250 MG PO TABS: 1000.0000 mg | ORAL_TABLET | Freq: Once | ORAL | 1 refills | Status: AC

## 2017-12-20 NOTE — Addendum Note (Signed)
Addended by: Reva BoresPRATT, Douglas Rooks S on: 12/20/2017 08:23 PM   Modules accepted: Orders

## 2017-12-23 ENCOUNTER — Other Ambulatory Visit: Payer: Self-pay | Admitting: General Practice

## 2017-12-23 DIAGNOSIS — A749 Chlamydial infection, unspecified: Secondary | ICD-10-CM

## 2017-12-23 MED ORDER — AZITHROMYCIN 250 MG PO TABS: 1000.0000 mg | ORAL_TABLET | Freq: Once | ORAL | 0 refills | Status: DC

## 2017-12-25 ENCOUNTER — Telehealth: Payer: Self-pay | Admitting: *Deleted

## 2017-12-25 NOTE — Telephone Encounter (Signed)
Received a voicemail from last evening stating she had to get a rx prescribed and has left several messages stating rx was sent to wrong pharmacy. Needs it sent to [facility]. Per chart review was sent to that pharmacy.  I called Phyllis Nichols and verified she has not picked it up. I informed her I will call pharmacy and attempt to get it straightened out and If I have issues will call her back; otherwise she should check with the pharmacy in a few hours. I also reviewed partner should be treated and they should avoid any intimate contact until both treated and wait 2 weeks.

## 2017-12-25 NOTE — Telephone Encounter (Signed)
I called [facility] and they received order and have it ready for pick up. Will send MyChart message to pt.

## 2018-01-04 ENCOUNTER — Encounter: Payer: Self-pay | Admitting: Obstetrics and Gynecology

## 2018-01-06 MED ORDER — AZITHROMYCIN 250 MG PO TABS: 1000.0000 mg | ORAL_TABLET | Freq: Once | ORAL | 0 refills | Status: AC

## 2018-01-24 ENCOUNTER — Other Ambulatory Visit: Payer: Self-pay

## 2018-01-24 ENCOUNTER — Emergency Department (HOSPITAL_COMMUNITY): Payer: No Typology Code available for payment source

## 2018-01-24 ENCOUNTER — Emergency Department (HOSPITAL_COMMUNITY)
Admission: EM | Admit: 2018-01-24 | Discharge: 2018-01-24 | Disposition: A | Discharge status: Home / Self Care | Payer: No Typology Code available for payment source | Attending: Emergency Medicine | Admitting: Emergency Medicine

## 2018-01-24 ENCOUNTER — Encounter (HOSPITAL_COMMUNITY): Payer: Self-pay | Admitting: Nurse Practitioner

## 2018-01-24 DIAGNOSIS — Y929 Unspecified place or not applicable: Secondary | ICD-10-CM | POA: Insufficient documentation

## 2018-01-24 DIAGNOSIS — Y939 Activity, unspecified: Secondary | ICD-10-CM | POA: Insufficient documentation

## 2018-01-24 DIAGNOSIS — M25512 Pain in left shoulder: Secondary | ICD-10-CM

## 2018-01-24 DIAGNOSIS — M542 Cervicalgia: Secondary | ICD-10-CM | POA: Diagnosis not present

## 2018-01-24 DIAGNOSIS — Z041 Encounter for examination and observation following transport accident: Secondary | ICD-10-CM | POA: Diagnosis present

## 2018-01-24 DIAGNOSIS — Y999 Unspecified external cause status: Secondary | ICD-10-CM | POA: Insufficient documentation

## 2018-01-24 MED ORDER — OXYCODONE-ACETAMINOPHEN 5-325 MG PO TABS
1.0000 | ORAL_TABLET | Freq: Once | ORAL | Status: AC
  Administered 2018-01-24: 1 via ORAL
  Filled 2018-01-24: qty 1

## 2018-01-24 MED ORDER — ONDANSETRON 4 MG PO TBDP
4.0000 mg | ORAL_TABLET | Freq: Once | ORAL | Status: AC
Start: 2018-01-24 — End: 2018-01-24
  Administered 2018-01-24: 4 mg via ORAL
  Filled 2018-01-24: qty 1

## 2018-01-24 MED ORDER — CYCLOBENZAPRINE HCL 5 MG PO TABS: 5.0000 mg | ORAL_TABLET | Freq: Two times a day (BID) | ORAL | 0 refills | Status: AC | PRN

## 2018-01-24 MED ORDER — IBUPROFEN 800 MG PO TABS: 800.0000 mg | ORAL_TABLET | Freq: Three times a day (TID) | ORAL | 0 refills | Status: AC
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
## 2018-01-24 NOTE — Discharge Instructions (Addendum)
X-ray of your neck showed that you do not have any fractures or any areas of slippage or dislocation. Shoulder x-ray was normal too. No fractures or dislocations. This is good news!  You may use Tylenol and/or Ibuprofen/Naproxen for pain relief and swelling. You may also use warm or cold compresses for additional relief. I have also prescribed you a muscle relaxer to help if you begin to have muscle spasms in your neck.  I am sorry your weekend started this way! I hope it gets better from here.
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
## 2018-01-26 ENCOUNTER — Encounter (HOSPITAL_COMMUNITY): Payer: Self-pay

## 2018-01-26 ENCOUNTER — Ambulatory Visit (HOSPITAL_COMMUNITY)
Admission: EM | Admit: 2018-01-26 | Discharge: 2018-01-26 | Disposition: A | Discharge status: Home / Self Care | Payer: Self-pay | Attending: Internal Medicine | Admitting: Internal Medicine

## 2018-01-26 ENCOUNTER — Ambulatory Visit (INDEPENDENT_AMBULATORY_CARE_PROVIDER_SITE_OTHER): Payer: Self-pay

## 2018-01-26 DIAGNOSIS — M542 Cervicalgia: Secondary | ICD-10-CM

## 2018-01-26 DIAGNOSIS — R0781 Pleurodynia: Secondary | ICD-10-CM
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
## 2018-01-26 NOTE — Discharge Instructions (Signed)
Use anti-inflammatories for pain/swelling. You may take up to 800 mg Ibuprofen every 8 hours with food. You may supplement Ibuprofen with Tylenol [phone] mg every 8 hours.   Alternate Ice and Heating pad  Pain should gradually improve over the next 1 to 2 weeks  Please follow-up if symptoms worsening, developing numbness or tingling, worsening difficulty breathing.

## 2018-02-26 ENCOUNTER — Other Ambulatory Visit: Payer: Self-pay | Admitting: General Practice

## 2018-02-26 DIAGNOSIS — B379 Candidiasis, unspecified: Secondary | ICD-10-CM

## 2018-02-26 MED ORDER — FLUCONAZOLE 150 MG PO TABS: 150.0000 mg | ORAL_TABLET | Freq: Once | ORAL | 0 refills | Status: AC

## 2018-03-11 ENCOUNTER — Ambulatory Visit (INDEPENDENT_AMBULATORY_CARE_PROVIDER_SITE_OTHER): Payer: Medicaid Other | Admitting: Student

## 2018-03-11 ENCOUNTER — Other Ambulatory Visit (HOSPITAL_COMMUNITY)
Admission: RE | Admit: 2018-03-11 | Discharge: 2018-03-11 | Disposition: A | Discharge status: Home / Self Care | Payer: Medicaid Other | Source: Ambulatory Visit | Attending: Student | Admitting: Student

## 2018-03-11 ENCOUNTER — Encounter: Payer: Self-pay | Admitting: *Deleted

## 2018-03-11 DIAGNOSIS — B9689 Other specified bacterial agents as the cause of diseases classified elsewhere: Secondary | ICD-10-CM | POA: Diagnosis not present

## 2018-03-11 DIAGNOSIS — R8290 Unspecified abnormal findings in urine: Secondary | ICD-10-CM

## 2018-03-11 DIAGNOSIS — N760 Acute vaginitis: Secondary | ICD-10-CM | POA: Insufficient documentation

## 2018-03-11 DIAGNOSIS — N898 Other specified noninflammatory disorders of vagina: Secondary | ICD-10-CM | POA: Diagnosis present

## 2018-03-11 LAB — POCT URINALYSIS DIP (DEVICE)
Glucose, UA: NEGATIVE mg/dL
KETONES UR: 15 mg/dL — AB
Nitrite: NEGATIVE
PH: 6 (ref 5.0–8.0)
Protein, ur: 30 mg/dL — AB
Urobilinogen, UA: 0.2 mg/dL (ref 0.0–1.0)

## 2018-03-11 NOTE — Progress Notes (Signed)
Pt here today for c/o feeling like she does not empty her  bladder fully.  Pt also is requesting to have a self swab because she states that she is having sex and " I just don't trust men."  Pt explained how to obtain self swab, notified her that the urine will be sent for culture, and that we will call her if there are any abnormal results. Pt stated understanding with no further questions.

## 2018-03-12 NOTE — Progress Notes (Signed)
Chart reviewed for nurse visit. Agree with plan of care.   Marylene Land, CNM 03/12/2018 9:11 AM

## 2018-03-13 LAB — URINE CULTURE

## 2018-03-13 LAB — CERVICOVAGINAL ANCILLARY ONLY
BACTERIAL VAGINITIS: POSITIVE — AB
Candida vaginitis: NEGATIVE
Chlamydia: NEGATIVE
Neisseria Gonorrhea: NEGATIVE
TRICH (WINDOWPATH): NEGATIVE

## 2018-03-14 ENCOUNTER — Other Ambulatory Visit: Payer: Self-pay | Admitting: Student

## 2018-03-14 MED ORDER — CIPROFLOXACIN HCL 500 MG PO TABS: 500.0000 mg | ORAL_TABLET | Freq: Two times a day (BID) | ORAL | 0 refills | Status: DC

## 2018-03-14 MED ORDER — METRONIDAZOLE 500 MG PO TABS: 500.0000 mg | ORAL_TABLET | Freq: Two times a day (BID) | ORAL | 0 refills | Status: DC

## 2018-03-17 ENCOUNTER — Other Ambulatory Visit: Payer: Self-pay

## 2018-03-17 MED ORDER — FLUCONAZOLE 150 MG PO TABS: 150.0000 mg | ORAL_TABLET | Freq: Once | ORAL | 2 refills | Status: AC

## 2018-04-01 ENCOUNTER — Emergency Department (HOSPITAL_COMMUNITY): Payer: Self-pay

## 2018-04-01 ENCOUNTER — Encounter (HOSPITAL_COMMUNITY): Payer: Self-pay | Admitting: Emergency Medicine

## 2018-04-01 ENCOUNTER — Other Ambulatory Visit: Payer: Self-pay

## 2018-04-01 ENCOUNTER — Emergency Department (HOSPITAL_COMMUNITY)
Admission: EM | Admit: 2018-04-01 | Discharge: 2018-04-01 | Disposition: A | Discharge status: Home / Self Care | Payer: Self-pay | Attending: Emergency Medicine | Admitting: Emergency Medicine

## 2018-04-01 DIAGNOSIS — R059 Cough, unspecified: Secondary | ICD-10-CM

## 2018-04-01 DIAGNOSIS — R05 Cough: Secondary | ICD-10-CM | POA: Insufficient documentation

## 2018-04-01 DIAGNOSIS — F329 Major depressive disorder, single episode, unspecified: Secondary | ICD-10-CM | POA: Insufficient documentation

## 2018-04-01 MED ORDER — BENZONATATE 100 MG PO CAPS: 100.0000 mg | ORAL_CAPSULE | Freq: Three times a day (TID) | ORAL | 0 refills | Status: DC

## 2018-04-01 MED ORDER — IBUPROFEN 800 MG PO TABS: 800.0000 mg | ORAL_TABLET | Freq: Three times a day (TID) | ORAL | 0 refills | Status: DC

## 2018-04-01 NOTE — ED Provider Notes (Signed)
[facility]EMERGENCY DEPT Provider Note   CSN: [id number] Arrival date & time: 04/01/18  4098     History   Chief Complaint Chief Complaint  Patient presents with  . Cough    HPI Phyllis Nichols is a 22 y.o. female.  The history is provided by the patient and medical records.     22 y.o. F with hx of depression, frequent UTI's, presenting to the ED for cough.  Patient reports she has had a dry, hacking cough for about 8 days now and cannot get any relief.  She denies fever, ear pain, sore throat, or other URI symptoms.  No SOB. Some mild chest wall pain now from repetitive coughing.  She has no known cardiac history.  She has been trying multiple over-the-counter medications without relief.  States she primarily came in tonight so she could get some medicine to help control coughing so she could sleep.  Past Medical History:  Diagnosis Date  . Concussion   . Depression    doing geat right now  . Ectopic pregnancy 07/2016  . Infection    UTI  . Sexually transmissible disease     Patient Active Problem List   Diagnosis Date Noted  . 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 DILATION AND CURETTAGE OF UTERUS     TAB  . LAPAROSCOPY Left 07/31/2016   Procedure: LAPAROSCOPY DIAGNOSTIC WITH LEFT  SALPINGECTOMY;  Surgeon: Pickering Bing, MD;  Location: [facility];  Service: Gynecology;  Laterality: Left;  Left sided ectopic    . WISDOM TOOTH EXTRACTION       OB History    Gravida  2   Para      Term      Preterm      AB  2   Living        SAB      TAB  1   Ectopic  1   Multiple      Live Births               Home Medications    Prior to Admission medications   Medication Sig Start Date End Date Taking? Authorizing Provider  ciprofloxacin (CIPRO) 500 MG tablet Take 1 tablet (500 mg total) by mouth 2 (two) times daily. 03/14/18   Marylene Land, CNM  metroNIDAZOLE (FLAGYL)  500 MG tablet Take 1 tablet (500 mg total) by mouth 2 (two) times daily. 03/14/18   Marylene Land, CNM    Family History Family History  Adopted: Yes  Problem Relation Age of Onset  . Cancer Mother 20       lung  . Cancer Maternal Uncle        prostate  . Cancer Maternal Grandmother        breast    Social History Social History   Tobacco Use  . Smoking status: Never Smoker  . Smokeless tobacco: Never Used  Substance Use Topics  . Alcohol use: Yes    Comment: occassional  . Drug use: No     Allergies   Other   Review of Systems Review of Systems  Respiratory: Positive for cough.   All other systems reviewed and are negative.    Physical Exam Updated Vital Signs BP 91/80 (BP Location: Right Arm)   Pulse 68   Temp 98.7 F (37.1 C) (Oral)   Resp 14   Ht 5\' 4"  (1.626 m)   Wt 63.5  kg   LMP 03/30/2018   SpO2 99%   BMI 24.03 kg/m   Physical Exam  Constitutional: She is oriented to person, place, and time. She appears well-developed and well-nourished.  HENT:  Head: Normocephalic and atraumatic.  Right Ear: Tympanic membrane and ear canal normal.  Left Ear: Tympanic membrane and ear canal normal.  Nose: Nose normal.  Mouth/Throat: Uvula is midline, oropharynx is clear and moist and mucous membranes are normal.  Eyes: Pupils are equal, round, and reactive to light. Conjunctivae and EOM are normal.  Neck: Normal range of motion.  Cardiovascular: Normal rate, regular rhythm and normal heart sounds.  Pulmonary/Chest: Effort normal and breath sounds normal. She has no decreased breath sounds. She has no wheezes.  Dry cough, no distress, lungs clear  Abdominal: Soft. Bowel sounds are normal.  Musculoskeletal: Normal range of motion.  Neurological: She is alert and oriented to person, place, and time.  Skin: Skin is warm and dry.  Psychiatric: She has a normal mood and affect.  Nursing note and vitals reviewed.    ED Treatments / Results   Labs (all labs ordered are listed, but only abnormal results are displayed) Labs Reviewed - No data to display  EKG None  Radiology Dg Chest 2 View  Result Date: 04/01/2018 CLINICAL DATA:  Cough and congestion for 2 weeks. EXAM: CHEST - 2 VIEW COMPARISON:  01/26/2018 FINDINGS: The cardiomediastinal contours are normal. The lungs are clear. Pulmonary vasculature is normal. No consolidation, pleural effusion, or pneumothorax. No acute osseous abnormalities are seen. IMPRESSION: Negative radiographs of the chest. Electronically Signed   By: Narda Rutherford M.D.   On: 04/01/2018 04:09    Procedures Procedures (including critical care time)  Medications Ordered in ED Medications - No data to display   Initial Impression / Assessment and Plan / ED Course  I have reviewed the triage vital signs and the nursing notes.  Pertinent labs & imaging results that were available during my care of the patient were reviewed by me and considered in my medical decision making (see chart for details).  22 year old female here with dry cough for the past 8 days.  She is afebrile and nontoxic.  Lungs are clear without any wheezes or rhonchi.  Remainder of exam is benign as well.  Chest x-ray negative.  Does report some chest pain, feel this is likely chest wall pain from repetitive coughing.  I doubt ACS, PE, dissection, or other acute cardiac event.  States she mainly came in today he is a medication to help her sleep and stop the coughing.  Will prescribe Tessalon and Motrin for chest wall pain.  Can follow-up closely with PCP.  Return here for any new or worsening symptoms.  Final Clinical Impressions(s) / ED Diagnoses   Final diagnoses:  Cough    ED Discharge Orders         Ordered    benzonatate (TESSALON) 100 MG capsule  Every 8 hours     04/01/18 0555    ibuprofen (ADVIL,MOTRIN) 800 MG tablet  3 times daily     04/01/18 0555           Garlon Hatchet, PA-C 04/01/18 0601    Molpus,  Jonny Ruiz, MD 04/01/18 [phone]

## 2018-04-01 NOTE — ED Triage Notes (Signed)
Pt c/o cough that is non productive for 2 weeks. Has tried OTC meds with no relief. Reports coughing kept her awake tonight as to why she came in for something to help. Denies congestion, fevers, or any other symptoms.

## 2018-04-01 NOTE — Discharge Instructions (Signed)
Take the prescribed medication as directed. °Follow-up with your primary care doctor. °Return to the ED for new or worsening symptoms. °

## 2018-04-10 NOTE — Progress Notes (Signed)
Chart reviewed for nurse visit. Agree with plan of care.   Marylene Land, CNM 04/10/2018 11:09 AM

## 2018-04-14 ENCOUNTER — Other Ambulatory Visit: Payer: Self-pay | Admitting: General Practice

## 2018-04-14 ENCOUNTER — Ambulatory Visit: Payer: Medicaid Other

## 2018-04-14 DIAGNOSIS — B379 Candidiasis, unspecified: Secondary | ICD-10-CM

## 2018-04-14 MED ORDER — FLUCONAZOLE 150 MG PO TABS: 150.0000 mg | ORAL_TABLET | Freq: Once | ORAL | 0 refills | Status: AC

## 2018-06-11 ENCOUNTER — Other Ambulatory Visit: Payer: Self-pay

## 2018-06-11 MED ORDER — FLUCONAZOLE 150 MG PO TABS: 150.0000 mg | ORAL_TABLET | Freq: Once | ORAL | 0 refills | Status: AC

## 2018-06-11 NOTE — Telephone Encounter (Signed)
Diflucan prescribed per protocol for pt c/o vaginal itching with discharge.

## 2018-09-22 ENCOUNTER — Encounter (HOSPITAL_COMMUNITY): Payer: Self-pay

## 2018-09-22 ENCOUNTER — Ambulatory Visit (HOSPITAL_COMMUNITY)
Admission: EM | Admit: 2018-09-22 | Discharge: 2018-09-22 | Disposition: A | Discharge status: Home / Self Care | Payer: Self-pay | Attending: Internal Medicine | Admitting: Internal Medicine

## 2018-09-22 ENCOUNTER — Ambulatory Visit (INDEPENDENT_AMBULATORY_CARE_PROVIDER_SITE_OTHER): Payer: Self-pay

## 2018-09-22 DIAGNOSIS — S6991XA Unspecified injury of right wrist, hand and finger(s), initial encounter: Secondary | ICD-10-CM

## 2018-09-22 DIAGNOSIS — S60221A Contusion of right hand, initial encounter: Secondary | ICD-10-CM

## 2018-09-22 DIAGNOSIS — S63501A Unspecified sprain of right wrist, initial encounter: Secondary | ICD-10-CM

## 2018-09-22 DIAGNOSIS — S56911A Strain of unspecified muscles, fascia and tendons at forearm level, right arm, initial encounter: Secondary | ICD-10-CM

## 2018-09-22 NOTE — Discharge Instructions (Signed)
Rest and elevate the affected painful area.  Apply cold compresses intermittently  for 15 min at a time as needed.  As pain recedes, begin normal activities slowly as tolerated. Take Ibuprofen up to 800 mg every 8h and you may add Tylenol up to 1000 mg between doses if you need more pain relief.  Follow up with your family Dr in 1 week, or call to get established with Mrs Jerrilyn Cairo Nurse Practioner.
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
## 2019-04-20 ENCOUNTER — Other Ambulatory Visit: Payer: Self-pay

## 2019-04-20 ENCOUNTER — Emergency Department (HOSPITAL_COMMUNITY)
Admission: EM | Admit: 2019-04-20 | Discharge: 2019-04-20 | Discharge status: Against Medical Advice | Payer: Medicaid Other | Attending: Emergency Medicine | Admitting: Emergency Medicine

## 2019-04-20 ENCOUNTER — Encounter (HOSPITAL_COMMUNITY): Payer: Self-pay | Admitting: Emergency Medicine

## 2019-04-20 DIAGNOSIS — F32A Depression, unspecified: Secondary | ICD-10-CM

## 2019-04-20 DIAGNOSIS — Z5320 Procedure and treatment not carried out because of patient's decision for unspecified reasons: Secondary | ICD-10-CM | POA: Insufficient documentation

## 2019-04-20 DIAGNOSIS — F329 Major depressive disorder, single episode, unspecified: Secondary | ICD-10-CM | POA: Insufficient documentation

## 2019-04-20 NOTE — ED Notes (Signed)
When going to place the TTS machine in the room, the pt was not found in the room. She was not visualized leaving the department by Kallie Locks, RN, or myself.

## 2019-04-20 NOTE — ED Triage Notes (Signed)
Pt tearful at time of triage and reporting feeling depressed for the last several weeks and stating she wants help. Pt denies any SI/HI.

## 2019-04-20 NOTE — ED Notes (Signed)
Pt returned to room and Dr.Mesner in to speak with pt.

## 2019-04-20 NOTE — ED Notes (Signed)
Dr.Mesner notified of pt elopement.

## 2019-04-20 NOTE — ED Provider Notes (Signed)
[facility]-EMERGENCY DEPT Provider Note   CSN: [id number] Arrival date & time: 04/20/19  0027     History   Chief Complaint Chief Complaint  Patient presents with  . Depression    HPI Phyllis Nichols is a 23 y.o. female.      Depression This is a recurrent problem. The problem occurs constantly. The problem has been gradually worsening. Pertinent negatives include no chest pain, no abdominal pain and no headaches. Nothing aggravates the symptoms. Nothing relieves the symptoms. She has tried nothing for the symptoms.    Past Medical History:  Diagnosis Date  . Concussion   . Depression    doing geat right now  . Ectopic pregnancy 07/2016  . Infection    UTI  . Sexually transmissible disease     Patient Active Problem List   Diagnosis Date Noted  . 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 DILATION AND CURETTAGE OF UTERUS     TAB  . LAPAROSCOPY Left 07/31/2016   Procedure: LAPAROSCOPY DIAGNOSTIC WITH LEFT  SALPINGECTOMY;  Surgeon: [place] Bing, MD;  Location: [facility];  Service: Gynecology;  Laterality: Left;  Left sided ectopic    . WISDOM TOOTH EXTRACTION       OB History    Gravida  2   Para      Term      Preterm      AB  2   Living        SAB      TAB  1   Ectopic  1   Multiple      Live Births               Home Medications    Prior to Admission medications   Not on File    Family History Family History  Adopted: Yes  Problem Relation Age of Onset  . Cancer Mother 76       lung  . Cancer Maternal Uncle        prostate  . Cancer Maternal Grandmother        breast    Social History Social History   Tobacco Use  . Smoking status: Never Smoker  . Smokeless tobacco: Never Used  Substance Use Topics  . Alcohol use: Yes    Comment: occassional  . Drug use: No     Allergies   Other   Review of Systems Review of Systems  Cardiovascular:  Negative for chest pain.  Gastrointestinal: Negative for abdominal pain.  Neurological: Negative for headaches.  Psychiatric/Behavioral: Positive for depression.  All other systems reviewed and are negative.    Physical Exam Updated Vital Signs BP (!) 146/108 (BP Location: Left Arm)   Pulse (!) 112   Temp 98.2 F (36.8 C) (Oral)   Resp 17   Ht 5\' 4"  (1.626 m)   Wt 67.1 kg   LMP 03/21/2019   SpO2 100%   BMI 25.40 kg/m   Physical Exam Vitals signs and nursing note reviewed.  Constitutional:      Appearance: She is well-developed.  HENT:     Head: Normocephalic and atraumatic.     Mouth/Throat:     Mouth: Mucous membranes are dry.     Pharynx: Oropharynx is clear.  Eyes:     Conjunctiva/sclera: Conjunctivae normal.  Neck:     Musculoskeletal: Normal range of motion.  Cardiovascular:     Rate and Rhythm: Normal rate and regular  rhythm.  Pulmonary:     Effort: No respiratory distress.     Breath sounds: No stridor.  Abdominal:     General: There is no distension.  Musculoskeletal: Normal range of motion.        General: No swelling or tenderness.  Skin:    General: Skin is warm and dry.  Neurological:     General: No focal deficit present.     Mental Status: She is alert.  Psychiatric:        Mood and Affect: Mood is depressed.        Speech: Speech is not tangential.        Behavior: Behavior normal.        Thought Content: Thought content does not include homicidal or suicidal ideation. Thought content does not include homicidal or suicidal plan.      ED Treatments / Results  Labs (all labs ordered are listed, but only abnormal results are displayed) Labs Reviewed - No data to display  EKG None  Radiology No results found.  Procedures Procedures (including critical care time)  Medications Ordered in ED Medications - No data to display   Initial Impression / Assessment and Plan / ED Course  I have reviewed the triage vital signs and the nursing  notes.  Pertinent labs & imaging results that were available during my care of the patient were reviewed by me and considered in my medical decision making (see chart for details).        Depressed. No SI/HI.   Thinks we are not genuine. Sad about the same. No indication for IVC.   Eloped prior to tts.  Final Clinical Impressions(s) / ED Diagnoses   Final diagnoses:  Depression, unspecified depression type    ED Discharge Orders    None       Clodagh Odenthal, Corene Cornea, MD 04/20/19 [phone]

## 2019-04-20 NOTE — BHH Counselor (Signed)
Clinician received a call from Estill Bamberg, Retail banker at [facility], expressing the pt left the hospital. No TTS assessment is needed at this time.    Vertell Novak, MS, [facility], [facility] Triage Specialist [phone].

## 2019-04-20 NOTE — ED Notes (Addendum)
Pt states she is unable to wait any longer. "I came to get help and it's taking too long!" Ilene and I told her that the doctor will talk to her shortly in that room, but we are unable to keep her from leaving.

## 2019-04-20 NOTE — ED Notes (Signed)
Pt came back in to ask if she needed any paperwork or any proof stating that she was here.

## 2019-04-20 NOTE — ED Notes (Signed)
Pt stated that she couldn't wait anymore and was leaving.

## 2019-05-20 IMAGING — CR DG CHEST 2V
2 series · 2 of 2 positions shown · non-contrast
Comparison: 01/26/2018

CLINICAL DATA: Cough and congestion for 2 weeks.

EXAM:
CHEST - 2 VIEW

[w chest pa]
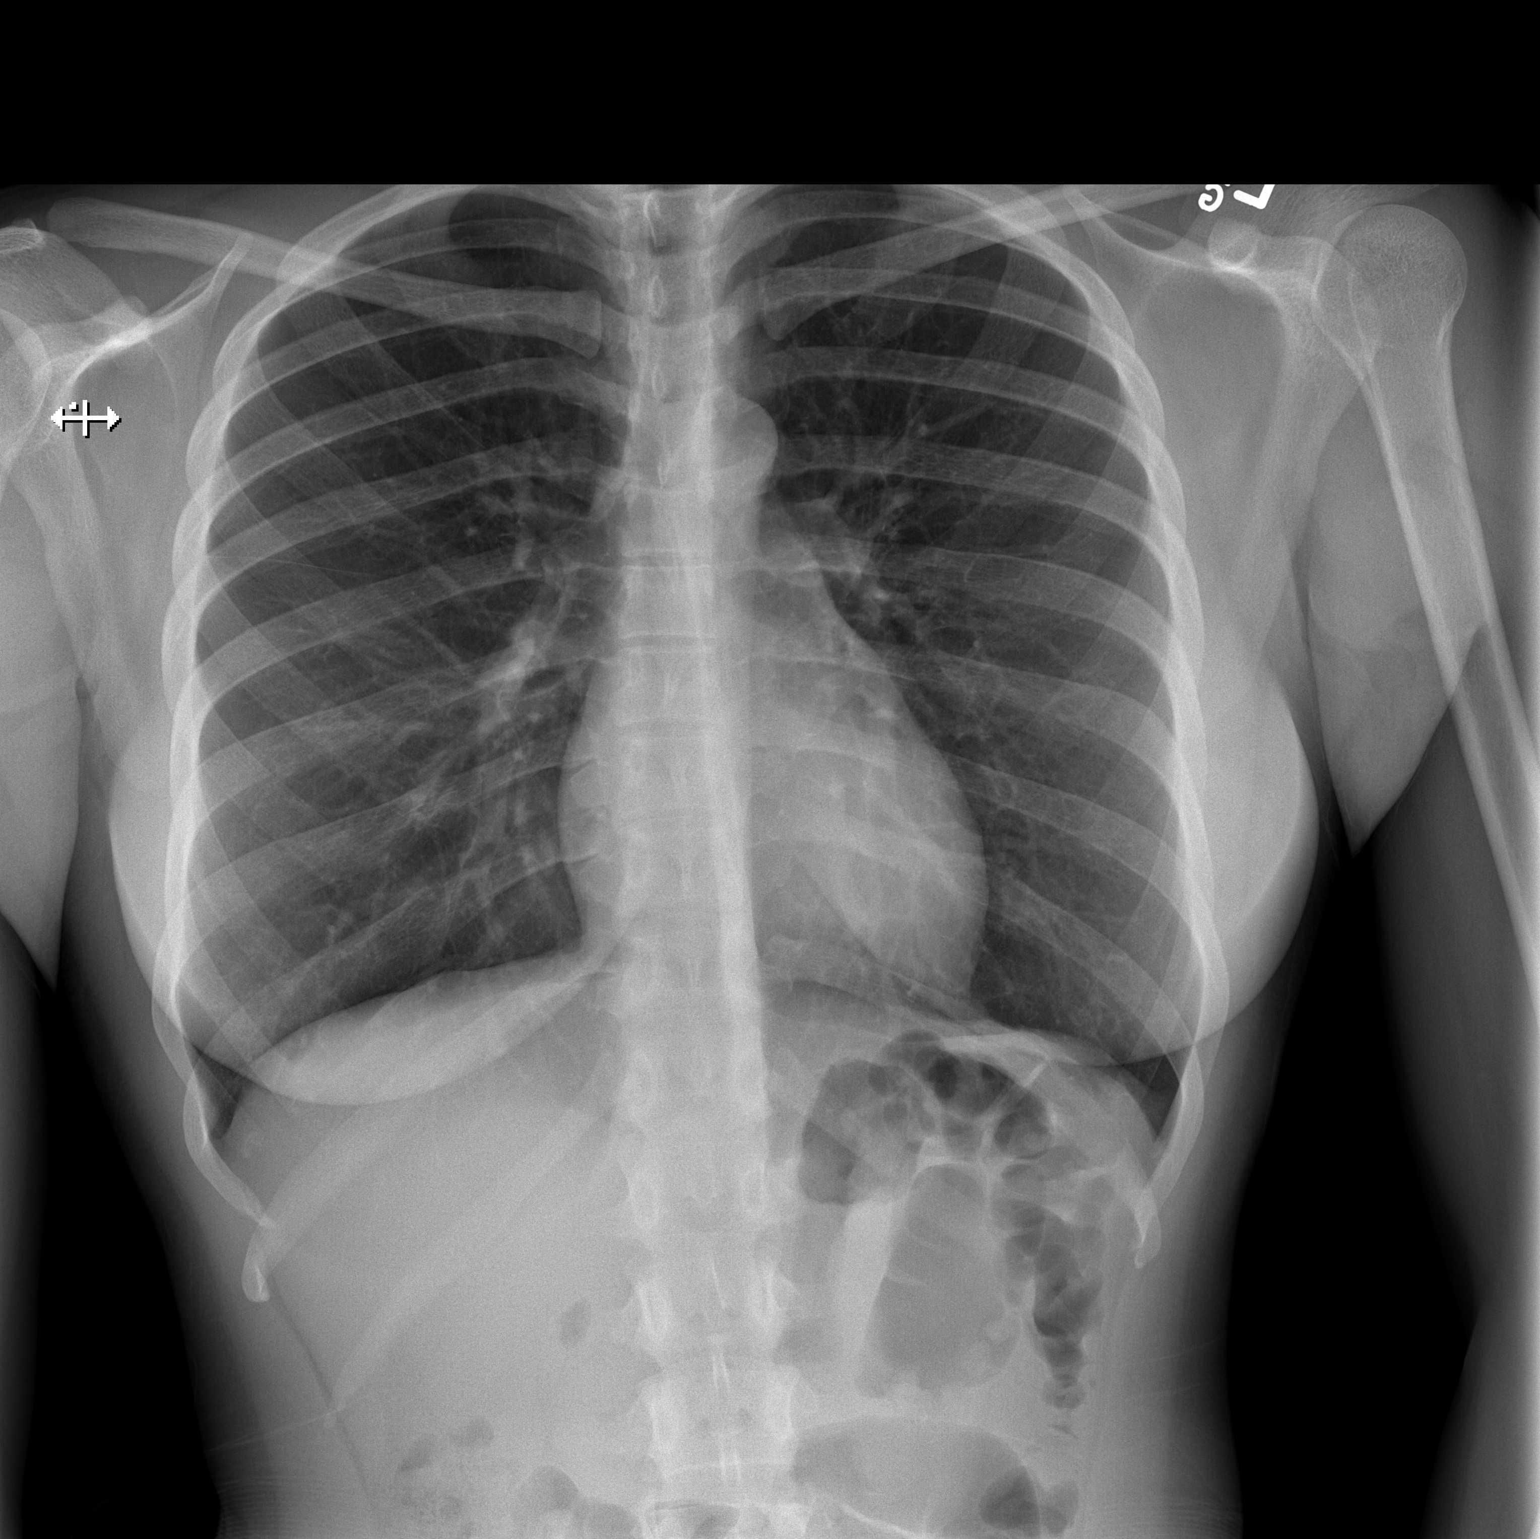

[w chest lat]
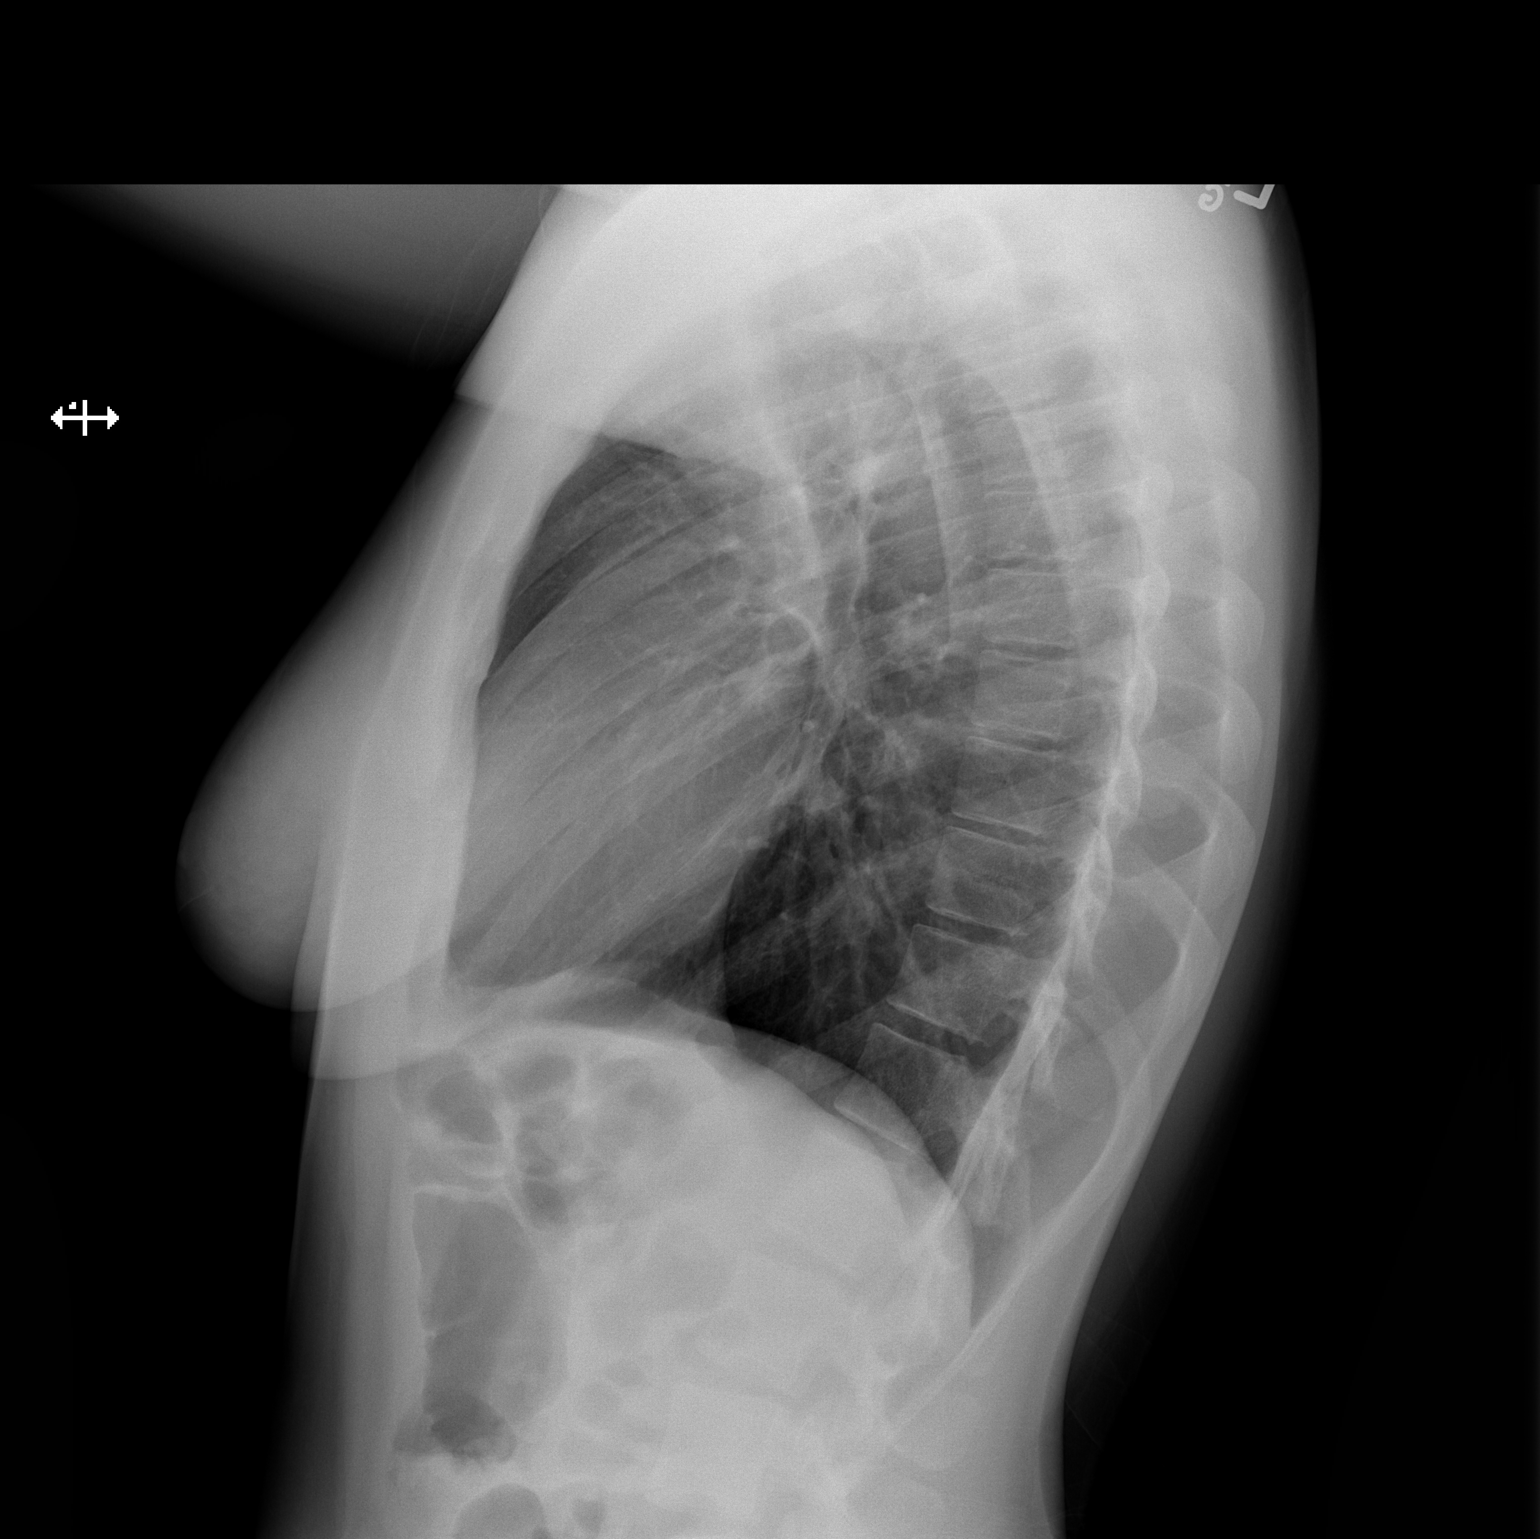

[2 of 2 positions shown; findings below may reference images not displayed]

FINDINGS: The cardiomediastinal contours are normal. The lungs are clear.
Pulmonary vasculature is normal. No consolidation, pleural effusion,
or pneumothorax. No acute osseous abnormalities are seen.
IMPRESSION: Negative radiographs of the chest.

## 2020-03-21 ENCOUNTER — Other Ambulatory Visit (HOSPITAL_COMMUNITY)
Admission: RE | Admit: 2020-03-21 | Discharge: 2020-03-21 | Disposition: A | Discharge status: Home / Self Care | Payer: Medicaid Other | Source: Ambulatory Visit | Attending: Obstetrics and Gynecology | Admitting: Obstetrics and Gynecology

## 2020-03-21 ENCOUNTER — Encounter: Payer: Self-pay | Admitting: Obstetrics and Gynecology

## 2020-03-21 ENCOUNTER — Ambulatory Visit (INDEPENDENT_AMBULATORY_CARE_PROVIDER_SITE_OTHER): Payer: Medicaid Other | Admitting: Obstetrics and Gynecology

## 2020-03-21 ENCOUNTER — Other Ambulatory Visit: Payer: Self-pay

## 2020-03-21 VITALS — BP 126/70 | HR 64 | Wt 166.2 lb

## 2020-03-21 DIAGNOSIS — Z124 Encounter for screening for malignant neoplasm of cervix: Secondary | ICD-10-CM | POA: Insufficient documentation

## 2020-03-21 DIAGNOSIS — F4321 Adjustment disorder with depressed mood: Secondary | ICD-10-CM | POA: Diagnosis not present

## 2020-03-21 DIAGNOSIS — Z113 Encounter for screening for infections with a predominantly sexual mode of transmission: Secondary | ICD-10-CM | POA: Diagnosis present

## 2020-03-21 DIAGNOSIS — Z01411 Encounter for gynecological examination (general) (routine) with abnormal findings: Secondary | ICD-10-CM

## 2020-03-21 MED ORDER — SERTRALINE HCL 50 MG PO TABS: 50.0000 mg | ORAL_TABLET | Freq: Every day | ORAL | 3 refills | Status: DC

## 2020-03-21 NOTE — Progress Notes (Signed)
GYNECOLOGY OFFICE VISIT NOTE  History:  24 y.o. G2P0020 here today for annual exam. She desires STI testing today and also desires pap today given that she will be moving for work before 07/2020 (last pap negative on 07/18/17). She does not desire birth control at this time given difficulty getting pregnant in the past and desire for future pregnancy at this time.  Pt reports notable stress and sadness given recent breakup. Reassuringly, she has good support from her father. 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 However, she endorses notable sadness.  She denies abnormal vaginal discharge, bleeding, pelvic pain or other concerns.   Past Medical History:  Diagnosis Date   Concussion    Depression    doing geat right now   Ectopic pregnancy 07/2016   Infection    UTI   Sexually transmissible disease     Past Surgical History:  Procedure Laterality Date   DILATION AND CURETTAGE OF UTERUS     TAB   LAPAROSCOPY Left 07/31/2016   Procedure: LAPAROSCOPY DIAGNOSTIC WITH LEFT  SALPINGECTOMY;  Surgeon: [place] Bing, MD;  Location: [facility];  Service: Gynecology;  Laterality: Left;  Left sided ectopic     WISDOM TOOTH EXTRACTION       Current Outpatient Medications:    sertraline (ZOLOFT) 50 MG tablet, Take 1 tablet (50 mg total) by mouth daily., Disp: 30 tablet, Rfl: 3  The following portions of the patient's history were reviewed and updated as appropriate: allergies, current medications, past family history, past medical history, past social history, past surgical history and problem list.   Health Maintenance:  Last pap: negative on 07/18/17 Last mammogram: n/a  Review of Systems:  Pertinent items noted in HPI and remainder of comprehensive ROS otherwise negative.   Objective:  Physical Exam BP 126/70    Pulse 64    Wt 166 lb 3.2 oz (75.4 kg)    LMP 02/23/2020 (Approximate)    BMI 28.53 kg/m  CONSTITUTIONAL:  Well-developed, well-nourished female in no acute distress.  HENT:  Normocephalic, atraumatic. External right and left ear normal. Moist mucous membranes. EYES: Conjunctivae and EOM are normal. Pupils are equal, round, and reactive to light. No scleral icterus.  NECK: Normal range of motion, supple, no masses SKIN: Skin is warm and dry. No rash noted. Not diaphoretic. No erythema. No pallor. NEUROLOGIC: Alert and oriented to person, place, and time. Normal reflexes, muscle tone coordination. No cranial nerve deficit noted. PSYCHIATRIC: Normal mood and affect. Normal behavior. Normal judgment and thought content. CARDIOVASCULAR: Normal heart rate noted RESPIRATORY: Normal work of breathing. ABDOMEN: Soft, no distention noted.   PELVIC: Normal appearing external genitalia; normal appearing vaginal mucosa and cervix.  No abnormal discharge noted.  Pap smear obtained.  Pelvic cultures obtained.  MUSCULOSKELETAL: Normal range of motion. No edema noted. PSYCH: Tearful at times when discussing recent relationship breakup. Reports passive SI but denies active SI, HI, and thoughts of self harm.  Exam done with chaperone present.  Labs and Imaging No results found.  Assessment & Plan:  1. Screening examination for STD (sexually transmitted disease): Normal pelvic exam today. No concerning GU symptoms. Collected swab for GC/Chlamydia per pt request. - counseled on use of condoms for STI prevention - Cervicovaginal ancillary only( [place])  2. Pap smear for cervical cancer screening: Last pap negative on 07/18/17 but pt requests pap today given inability to return to clinic in 07/2020  secondary to travel with work. - Cytology - PAP( [place])  3. Situational Depression: Pt reports notable stress and depressed mood given recent relationship breakup. In addition pt with PHQ9 positive for suicidal ideation. Reassuringly, pt denies active SI, HI and thoughts of self harm. Has support from her father  and feels comfortable calling him if worsening mood or safety concerns. - prescribed sertraline 50mg  daily s/p shared decision making with pt - provided phone # for National Suicide Prevention Line - encouraged regular exercise and healthy eating - plan for f/u appt in 1-2 weeks with with strict precautions to call if safety concerns  Routine preventative health maintenance measures emphasized. Please refer to After Visit Summary for other counseling recommendations.   Return in about 2 weeks (around 04/04/2020) for please schedule in person appt with jamie for mood check.   06/04/2020, MD OB Fellow, Faculty [facility] 03/21/2020 4:04 PM

## 2020-03-21 NOTE — Patient Instructions (Signed)
-  It was a pleasure meeting you today! -We recommend starting sertraline (zoloft) 1 tablet daily at night. It is important to take daily to have good benefit. -Please call our clinic or send a message if any safety concerns  National Suicide Prevention Hotline: 450-152-9115

## 2020-03-23 LAB — CERVICOVAGINAL ANCILLARY ONLY
Bacterial Vaginitis (gardnerella): NEGATIVE
Candida Glabrata: NEGATIVE
Candida Vaginitis: NEGATIVE
Chlamydia: NEGATIVE
Comment: NEGATIVE
Comment: NEGATIVE
Comment: NEGATIVE
Comment: NEGATIVE
Comment: NEGATIVE
Comment: NORMAL
Neisseria Gonorrhea: NEGATIVE
Trichomonas: NEGATIVE

## 2020-03-23 LAB — CYTOLOGY - PAP: Diagnosis: NEGATIVE

## 2020-04-04 NOTE — BH Specialist Note (Deleted)
Integrated Behavioral Health Initial Visit  MRN: [id number] Name: Phyllis Nichols  Number of Integrated Behavioral Health Clinician visits:: {IBH Number of Visits:21014052} Session Start time: 1:15***  Session End time: 2:15*** Total time: {IBH Total Time:21014050}  Type of Service: Integrated Behavioral Health- Individual/Family Interpretor:No. Interpretor Name and Language: n/a   Warm Hand Off Completed.       SUBJECTIVE: Phyllis Nichols is a 24 y.o. female accompanied by {CHL AMB ACCOMPANIED WU:9811914782} Patient was referred by [place] Bing, MD for ***. Patient reports the following symptoms/concerns: *** Duration of problem: ***; Severity of problem: {Mild/Moderate/Severe:20260}  OBJECTIVE: Mood: {BHH MOOD:22306} and Affect: {BHH AFFECT:22307} Risk of harm to self or others: {CHL AMB BH Suicide Current Mental Status:21022748}  LIFE CONTEXT: Family and Social: *** School/Work: *** Self-Care: *** Life Changes: Tubal pregnancy***  GOALS ADDRESSED: Patient will: 1. Reduce symptoms of: {IBH Symptoms:21014056} 2. Increase knowledge and/or ability of: {IBH Patient Tools:21014057}  3. Demonstrate ability to: {IBH Goals:21014053}  INTERVENTIONS: Interventions utilized: {IBH Interventions:21014054}  Standardized Assessments completed: {IBH Screening Tools:21014051}  ASSESSMENT: Patient currently experiencing ***.   Patient may benefit from ***.  PLAN: 1. Follow up with behavioral health clinician on : *** 2. Behavioral recommendations: *** 3. Referral(s): {IBH Referrals:21014055} 4. "From scale of 1-10, how likely are you to follow plan?": ***  Rae Lips, LCSW  Depression screen [facility] 2/9 03/21/2020 07/18/2017  Decreased Interest 2 0  Down, Depressed, Hopeless 1 0  PHQ - 2 Score 3 0  Altered sleeping 1 0  Tired, decreased energy 1 0  Change in appetite 1 0  Feeling bad or failure about yourself  1 0  Trouble concentrating 0 0  Moving slowly or  fidgety/restless 0 0  Suicidal thoughts 1 0  PHQ-9 Score 8 0   GAD 7 : Generalized Anxiety Score 03/21/2020 07/18/2017  Nervous, Anxious, on Edge 0 0  Control/stop worrying 3 0  Worry too much - different things 2 0  Trouble relaxing 2 0  Restless 2 0  Easily annoyed or irritable 1 0  Afraid - awful might happen 1 0  Total GAD 7 Score 11 0    ***

## 2020-04-07 ENCOUNTER — Institutional Professional Consult (permissible substitution): Payer: Medicaid Other

## 2020-04-19 ENCOUNTER — Institutional Professional Consult (permissible substitution): Payer: Medicaid Other

## 2020-09-12 ENCOUNTER — Ambulatory Visit (HOSPITAL_COMMUNITY)
Admission: EM | Admit: 2020-09-12 | Discharge: 2020-09-12 | Disposition: A | Discharge status: Home / Self Care | Payer: Medicaid Other | Attending: Emergency Medicine | Admitting: Emergency Medicine

## 2020-09-12 ENCOUNTER — Other Ambulatory Visit: Payer: Self-pay

## 2020-09-12 ENCOUNTER — Encounter (HOSPITAL_COMMUNITY): Payer: Self-pay

## 2020-09-12 DIAGNOSIS — Z202 Contact with and (suspected) exposure to infections with a predominantly sexual mode of transmission: Secondary | ICD-10-CM

## 2020-09-12 MED ORDER — DOXYCYCLINE HYCLATE 100 MG PO CAPS: 100.0000 mg | ORAL_CAPSULE | Freq: Two times a day (BID) | ORAL | 0 refills | Status: DC

## 2020-09-12 NOTE — ED Triage Notes (Signed)
Pt in with c/o std exposure. States her sexual partner tested positive for chlamydia  Denies any sxs

## 2020-09-12 NOTE — ED Provider Notes (Signed)
[facility]    CSN: [id number] Arrival date & time: 09/12/20  1043      History   Chief Complaint Chief Complaint  Patient presents with  . Exposure to STD    HPI Phyllis Nichols is a 25 y.o. female.   Patient presents with known exposure to chlamydia. Denies discharge, itching, new lesions, frequency, urgency, abdominal pain or back pain. LMP 3/3.   Past Medical History:  Diagnosis Date  . Concussion   . Depression    doing geat right now  . Ectopic pregnancy 07/2016  . Infection    UTI  . Sexually transmissible disease     Patient Active Problem List   Diagnosis Date Noted  . Situational depression 03/21/2020  . Screen for sexually transmitted diseases 03/21/2020  . 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 DILATION AND CURETTAGE OF UTERUS     TAB  . LAPAROSCOPY Left 07/31/2016   Procedure: LAPAROSCOPY DIAGNOSTIC WITH LEFT  SALPINGECTOMY;  Surgeon: [place] Bing, MD;  Location: [facility];  Service: Gynecology;  Laterality: Left;  Left sided ectopic    . WISDOM TOOTH EXTRACTION      OB History    Gravida  2   Para      Term      Preterm      AB  2   Living        SAB      IAB  1   Ectopic  1   Multiple      Live Births               Home Medications    Prior to Admission medications   Medication Sig Start Date End Date Taking? Authorizing Provider  doxycycline (VIBRAMYCIN) 100 MG capsule Take 1 capsule (100 mg total) by mouth 2 (two) times daily. 09/12/20  Yes White, Adrienne R, NP  sertraline (ZOLOFT) 50 MG tablet Take 1 tablet (50 mg total) by mouth daily. 03/21/20   Sheila Oats, MD    Family History Family History  Adopted: Yes  Problem Relation Age of Onset  . Cancer Mother 72       lung  . Cancer Maternal Uncle        prostate  . Cancer Maternal Grandmother        breast    Social History Social History   Tobacco Use  . Smoking status: Never Smoker  .  Smokeless tobacco: Never Used  Substance Use Topics  . Alcohol use: Yes    Comment: occassional  . Drug use: No     Allergies   Other   Review of Systems Review of Systems  Constitutional: Negative.   Respiratory: Negative.   Cardiovascular: Negative.   Gastrointestinal: Negative.   Genitourinary: Negative.      Physical Exam Triage Vital Signs ED Triage Vitals  Enc Vitals Group     BP 09/12/20 1140 117/69     Pulse Rate 09/12/20 1140 60     Resp 09/12/20 1140 18     Temp 09/12/20 1140 98.5 F (36.9 C)     Temp src --      SpO2 09/12/20 1140 98 %     Weight --      Height --      Head Circumference --      Peak Flow --      Pain Score 09/12/20 1139 0     Pain Loc --  Pain Edu? --      Excl. in GC? --    No data found.  Updated Vital Signs BP 117/69   Pulse 60   Temp 98.5 F (36.9 C)   Resp 18   LMP 09/01/2020 (Approximate)   SpO2 98%   Visual Acuity Right Eye Distance:   Left Eye Distance:   Bilateral Distance:    Right Eye Near:   Left Eye Near:    Bilateral Near:     Physical Exam Constitutional:      Appearance: Normal appearance. She is normal weight.  HENT:     Head: Normocephalic.  Eyes:     Extraocular Movements: Extraocular movements intact.  Pulmonary:     Effort: Pulmonary effort is normal.  Skin:    General: Skin is warm and dry.  Neurological:     Mental Status: She is alert and oriented to person, place, and time. Mental status is at baseline.  Psychiatric:        Mood and Affect: Mood normal.        Behavior: Behavior normal.        Thought Content: Thought content normal.        Judgment: Judgment normal.      UC Treatments / Results  Labs (all labs ordered are listed, but only abnormal results are displayed) Labs Reviewed - No data to display  EKG   Radiology No results found.  Procedures Procedures (including critical care time)  Medications Ordered in UC Medications - No data to display  Initial  Impression / Assessment and Plan / UC Course  I have reviewed the triage vital signs and the nursing notes.  Pertinent labs & imaging results that were available during my care of the patient were reviewed by me and considered in my medical decision making (see chart for details).  Std exposure  1. Treatment for chlamydia- doxycycline 100 mg bid for 7 days 2. Std screening- pending 2-3 days, will treat for positive infections per protocol  3.advised abstinence until completion of treatment  Final Clinical Impressions(s) / UC Diagnoses   Final diagnoses:  STD exposure     Discharge Instructions     Take antibiotic twice a day for 7 days, take all medication, if it starts to hurt your stomach take with food and full glass of water  Refrain from sex until your antibiotic course is completed, refrain from sex until your partner has completed course of antibiotics.   Labs pending 2-3 days, you will be notified of positive results for further treatment    ED Prescriptions    Medication Sig Dispense Auth. Provider   doxycycline (VIBRAMYCIN) 100 MG capsule Take 1 capsule (100 mg total) by mouth 2 (two) times daily. 14 capsule White, Elita Boone, NP     PDMP not reviewed this encounter.   Valinda Hoar, NP 09/12/20 1158

## 2020-09-12 NOTE — Discharge Instructions (Addendum)
Take antibiotic twice a day for 7 days, take all medication, if it starts to hurt your stomach take with food and full glass of water  Refrain from sex until your antibiotic course is completed, refrain from sex until your partner has completed course of antibiotics.   Labs pending 2-3 days, you will be notified of positive results for further treatment

## 2020-09-13 ENCOUNTER — Telehealth (HOSPITAL_COMMUNITY): Payer: Self-pay | Admitting: Emergency Medicine

## 2020-09-13 LAB — CERVICOVAGINAL ANCILLARY ONLY
Bacterial Vaginitis (gardnerella): POSITIVE — AB
Candida Glabrata: NEGATIVE
Candida Vaginitis: NEGATIVE
Chlamydia: POSITIVE — AB
Comment: NEGATIVE
Comment: NEGATIVE
Comment: NEGATIVE
Comment: NEGATIVE
Comment: NEGATIVE
Comment: NORMAL
Neisseria Gonorrhea: NEGATIVE
Trichomonas: NEGATIVE

## 2020-09-13 MED ORDER — METRONIDAZOLE 500 MG PO TABS: 500.0000 mg | ORAL_TABLET | Freq: Two times a day (BID) | ORAL | 0 refills | Status: DC

## 2021-03-01 ENCOUNTER — Encounter: Payer: Self-pay | Admitting: Family Medicine

## 2021-03-01 ENCOUNTER — Other Ambulatory Visit (HOSPITAL_COMMUNITY)
Admission: RE | Admit: 2021-03-01 | Discharge: 2021-03-01 | Disposition: A | Discharge status: Home / Self Care | Payer: Medicaid Other | Source: Ambulatory Visit | Attending: Family Medicine | Admitting: Family Medicine

## 2021-03-01 ENCOUNTER — Ambulatory Visit (INDEPENDENT_AMBULATORY_CARE_PROVIDER_SITE_OTHER): Payer: Medicaid Other | Admitting: Family Medicine

## 2021-03-01 ENCOUNTER — Other Ambulatory Visit: Payer: Self-pay

## 2021-03-01 VITALS — BP 112/74 | HR 76 | Ht 64.0 in | Wt 150.1 lb

## 2021-03-01 DIAGNOSIS — Z113 Encounter for screening for infections with a predominantly sexual mode of transmission: Secondary | ICD-10-CM | POA: Diagnosis present

## 2021-03-01 DIAGNOSIS — N760 Acute vaginitis: Secondary | ICD-10-CM | POA: Diagnosis not present

## 2021-03-01 DIAGNOSIS — N7011 Chronic salpingitis: Secondary | ICD-10-CM | POA: Diagnosis not present

## 2021-03-01 DIAGNOSIS — B9689 Other specified bacterial agents as the cause of diseases classified elsewhere: Secondary | ICD-10-CM

## 2021-03-01 DIAGNOSIS — Z01419 Encounter for gynecological examination (general) (routine) without abnormal findings: Secondary | ICD-10-CM

## 2021-03-01 NOTE — Assessment & Plan Note (Signed)
Patient is s/p L salpingectomy due to ectopic pregnancy in 2018, R hydrosalpinx noted during operation. She is not currently planning to get pregnant but wants to know if she will have problems. Discussed that currently have no evidence of infertility and do not recommend testing such as HSG. Recommend trying once she is ready and seeking infertility eval if there is difficulty.

## 2021-03-01 NOTE — Progress Notes (Signed)
Patient left before labs were drawn went looking for her but she had already walked out without checking out.Marland Kitchen   lab

## 2021-03-01 NOTE — Progress Notes (Signed)
Patient declined flu shot  [place], [place]

## 2021-03-01 NOTE — Progress Notes (Signed)
GYNECOLOGY OFFICE VISIT NOTE  History:   Phyllis Nichols is a 25 y.o. G2P0020 here today for annual exam.  No concerns  Health Maintenance Due  Topic Date Due   HPV VACCINES (1 - 2-dose series) Never done   HIV Screening  Never done   Hepatitis C Screening  Never done   INFLUENZA VACCINE  01/30/2021    Past Medical History:  Diagnosis Date   Concussion    Depression    doing geat right now   Ectopic pregnancy 07/2016   Infection    UTI   Sexually transmissible disease     Past Surgical History:  Procedure Laterality Date   DILATION AND CURETTAGE OF UTERUS     TAB   LAPAROSCOPY Left 07/31/2016   Procedure: LAPAROSCOPY DIAGNOSTIC WITH LEFT  SALPINGECTOMY;  Surgeon: [place] Bing, MD;  Location: [facility];  Service: Gynecology;  Laterality: Left;  Left sided ectopic     WISDOM TOOTH EXTRACTION      The following portions of the patient's history were reviewed and updated as appropriate: allergies, current medications, past family history, past medical history, past social history, past surgical history and problem list.   Health Maintenance:   Last pap: Lab Results  Component Value Date   DIAGPAP  03/21/2020    - Negative for intraepithelial lesion or malignancy (NILM)     Last mammogram:  N/a    Review of Systems:  Pertinent items noted in HPI and remainder of comprehensive ROS otherwise negative.  Physical Exam:  BP 112/74   Pulse 76   Ht 5\' 4"  (1.626 m)   Wt 150 lb 1.6 oz (68.1 kg)   LMP 02/08/2021 (Exact Date)   BMI 25.76 kg/m  CONSTITUTIONAL: Well-developed, well-nourished female in no acute distress.  HEENT:  Normocephalic, atraumatic. External right and left ear normal. No scleral icterus.  NECK: Normal range of motion, supple, no masses noted on observation SKIN: No rash noted. Not diaphoretic. No erythema. No pallor. MUSCULOSKELETAL: Normal range of motion. No edema noted. NEUROLOGIC: Alert and oriented to person, place, and time. Normal  muscle tone coordination.  PSYCHIATRIC: Normal mood and affect. Normal behavior. Normal judgment and thought content. RESPIRATORY: Effort normal, no problems with respiration noted   Labs and Imaging No results found for this or any previous visit (from the past 168 hour(s)). No results found.    Assessment and Plan:   Problem List Items Addressed This Visit       Genitourinary   Right Hydrosalpinx    Patient is s/p L salpingectomy due to ectopic pregnancy in 2018, R hydrosalpinx noted during operation. She is not currently planning to get pregnant but wants to know if she will have problems. Discussed that currently have no evidence of infertility and do not recommend testing such as HSG. Recommend trying once she is ready and seeking infertility eval if there is difficulty.         Other   Well woman exam - Primary    Cancer screening: - Pap: up to date - Mammogram: n/a. Grandmother had breast cancer at age 26. Mother was smoker and had lung cancer. - Colonoscopy: n/a  Contraception: declined  Mood: Reports mood is good  Vaccinations: - Flu: declines - HPV: declines - COVID: declines  Metabolic: - DM: not indicated - Lipids: not indicated  ID: - STI: requests full STI panel      Other Visit Diagnoses     Screening examination for sexually transmitted disease  Relevant Orders   Cervicovaginal ancillary only( [place])   RPR+HBsAg+HCVAb+...       Routine preventative health maintenance measures emphasized. Please refer to After Visit Summary for other counseling recommendations.   Return in 1 year (on 03/01/2022), or if symptoms worsen or fail to improve, for Annual Wellness Visit.    Total face-to-face time with patient: 20 minutes.  Over 50% of encounter was spent on counseling and coordination of care.   Venora Maples, MD/MPH Attending Family Medicine Physician, [facility] for [facility], [facility]

## 2021-03-01 NOTE — Assessment & Plan Note (Signed)
Cancer screening: - Pap: up to date - Mammogram: n/a. Grandmother had breast cancer at age 26. Mother was smoker and had lung cancer. - Colonoscopy: n/a  Contraception: declined  Mood: Reports mood is good  Vaccinations: - Flu: declines - HPV: declines - COVID: declines  Metabolic: - DM: not indicated - Lipids: not indicated  ID: - STI: requests full STI panel

## 2021-03-02 LAB — CERVICOVAGINAL ANCILLARY ONLY
Bacterial Vaginitis (gardnerella): POSITIVE — AB
Candida Glabrata: NEGATIVE
Candida Vaginitis: NEGATIVE
Chlamydia: NEGATIVE
Comment: NEGATIVE
Comment: NEGATIVE
Comment: NEGATIVE
Comment: NEGATIVE
Comment: NEGATIVE
Comment: NORMAL
Neisseria Gonorrhea: NEGATIVE
Trichomonas: NEGATIVE

## 2021-03-02 MED ORDER — METRONIDAZOLE 500 MG PO TABS: 500.0000 mg | ORAL_TABLET | Freq: Two times a day (BID) | ORAL | 0 refills | Status: AC

## 2021-03-02 NOTE — Addendum Note (Signed)
Addended by: Merian Capron on: 03/02/2021 02:11 PM   Modules accepted: Orders

## 2021-09-22 ENCOUNTER — Ambulatory Visit: Payer: Medicaid Other

## 2021-09-27 ENCOUNTER — Ambulatory Visit: Payer: Medicaid Other

## 2021-09-28 ENCOUNTER — Encounter (HOSPITAL_COMMUNITY): Payer: Self-pay

## 2021-09-28 ENCOUNTER — Ambulatory Visit (HOSPITAL_COMMUNITY)
Admission: EM | Admit: 2021-09-28 | Discharge: 2021-09-28 | Disposition: A | Discharge status: Home / Self Care | Payer: No Typology Code available for payment source | Attending: Family Medicine | Admitting: Family Medicine

## 2021-09-28 DIAGNOSIS — Z202 Contact with and (suspected) exposure to infections with a predominantly sexual mode of transmission: Secondary | ICD-10-CM

## 2021-09-28 DIAGNOSIS — Z113 Encounter for screening for infections with a predominantly sexual mode of transmission: Secondary | ICD-10-CM | POA: Diagnosis not present

## 2021-09-28 NOTE — Discharge Instructions (Signed)
You were seen today for std screening.  Today we only did the vaginal swab.  This will be resulted tomorrow and if there is anything that needs to be treated we will call you with results.  ?If you develop more symptoms of a urinary tract infection then please return for testing.  ?

## 2021-09-28 NOTE — ED Triage Notes (Signed)
Pt would like to be tested for STI with blood work.  ?

## 2021-09-28 NOTE — ED Provider Notes (Signed)
?[facility] ? ? ? ?CSN: [id number] ?Arrival date & time: 09/28/21  1423 ? ? ?  ? ?History   ?Chief Complaint ?Chief Complaint  ?Patient presents with  ? Exposure to STD  ? ? ?HPI ?Phyllis Nichols is a 26 y.o. female.  ? ?Patient is here for std screening. ?She has an apt with her pcp next week and elects to do blood work at that appointment.  ?No current symptoms.  ?No known exposures.  ?Sexually active, no condom use. ?Lmp was 2 weeks ago.  ?She woke up today with symptoms of a uti.  Slight pain at the lower abdomen.   She does not think she can given a urine today.  ? ?Past Medical History:  ?Diagnosis Date  ? Concussion   ? Depression   ? doing geat right now  ? Ectopic pregnancy 07/2016  ? Infection   ? UTI  ? Sexually transmissible disease   ? ? ?Patient Active Problem List  ? Diagnosis Date Noted  ? Well woman exam 03/01/2021  ? Situational depression 03/21/2020  ? Screen for sexually transmitted diseases 03/21/2020  ? 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 ? ?Past Surgical History:  ?Procedure Laterality Date  ? DILATION AND CURETTAGE OF UTERUS    ? TAB  ? LAPAROSCOPY Left 07/31/2016  ? Procedure: LAPAROSCOPY DIAGNOSTIC WITH LEFT  SALPINGECTOMY;  Surgeon:  Bing, MD;  Location: [facility];  Service: Gynecology;  Laterality: Left;  Left sided ectopic    ? WISDOM TOOTH EXTRACTION    ? ? ?OB History   ? ? Gravida  ?2  ? Para  ?   ? Term  ?   ? Preterm  ?   ? AB  ?2  ? Living  ?   ?  ? ? SAB  ?   ? IAB  ?1  ? Ectopic  ?1  ? Multiple  ?   ? Live Births  ?   ?   ?  ?  ? ? ? ?Home Medications   ? ?Prior to Admission medications   ?Medication Sig Start Date End Date Taking? Authorizing Provider  ?doxycycline (VIBRAMYCIN) 100 MG capsule Take 1 capsule (100 mg total) by mouth 2 (two) times daily. ?Patient not taking: Reported on 03/01/2021 09/12/20   Valinda Hoar, NP  ?sertraline (ZOLOFT) 50 MG tablet Take 1 tablet (50 mg total) by mouth daily. ?Patient not taking: Reported on 03/01/2021 03/21/20   Sheila Oats, MD  ? ? ?Family History ?Family History  ?Adopted: Yes  ?Problem Relation Age of Onset  ? Cancer Mother 68  ?     lung  ? Cancer Maternal Uncle   ?     prostate  ? Cancer Maternal Grandmother   ?     breast  ? ? ?Social History ?Social History  ? ?Tobacco Use  ? Smoking status: Never  ? Smokeless tobacco: Never  ?Substance Use Topics  ? Alcohol use: Yes  ?  Comment: occassional  ? Drug use: No  ? ? ? ?Allergies   ?Other and Cashew nut oil ? ? ?Review of Systems ?Review of Systems  ?Constitutional: Negative.   ?HENT: Negative.    ?Respiratory: Negative.    ?Cardiovascular: Negative.   ?Gastrointestinal:  Positive for abdominal pain.  ?Genitourinary: Negative.   ? ? ?Physical Exam ?Triage Vital Signs ?ED Triage Vitals  ?Enc Vitals Group  ?   BP 09/28/21 1504 (!) 123/38  ?   Pulse Rate 09/28/21 1504  66  ?   Resp 09/28/21 1504 16  ?   Temp 09/28/21 1504 97.9 ?F (36.6 ?C)  ?   Temp Source 09/28/21 1504 Oral  ?   SpO2 09/28/21 1504 100 %  ?   Weight --   ?   Height --   ?   Head Circumference --   ?   Peak Flow --   ?   Pain Score 09/28/21 1507 4  ?   Pain Loc --   ?   Pain Edu? --   ?   Excl. in GC? --   ? ?No data found. ? ?Updated Vital Signs ?BP (!) 123/38 (BP Location: Left Arm)   Pulse 66   Temp 97.9 ?F (36.6 ?C) (Oral)   Resp 16   LMP 09/13/2021   SpO2 100%  ? ?Visual Acuity ?Right Eye Distance:   ?Left Eye Distance:   ?Bilateral Distance:   ? ?Right Eye Near:   ?Left Eye Near:    ?Bilateral Near:    ? ?Physical Exam ?Constitutional:   ?   Appearance: Normal appearance.  ?Cardiovascular:  ?   Rate and Rhythm: Normal rate and regular rhythm.  ?Pulmonary:  ?   Effort: Pulmonary effort is normal.  ?   Breath sounds: Normal breath sounds.  ?Abdominal:  ?   General: Bowel sounds are normal.  ?   Palpations: Abdomen is soft.  ?   Tenderness: There is abdominal tenderness in the suprapubic area.  ?Neurological:  ?   Mental Status: She is alert.  ? ? ? ?UC Treatments / Results  ?Labs ?(all labs ordered are  listed, but only abnormal results are displayed) ?Labs Reviewed  ?RPR  ?HIV ANTIBODY (ROUTINE TESTING W REFLEX)  ?CERVICOVAGINAL ANCILLARY ONLY  ? ? ?EKG ? ? ?Radiology ?No results found. ? ?Procedures ?Procedures (including critical care time) ? ?Medications Ordered in UC ?Medications - No data to display ? ?Initial Impression / Assessment and Plan / UC Course  ?I have reviewed the triage vital signs and the nursing notes. ? ?Pertinent labs & imaging results that were available during my care of the patient were reviewed by me and considered in my medical decision making (see chart for details). ? ?Patient was seen today for STD screening.  She only wants the vaginal swab today and will do lab work next week at an appointment.  Will not treat at this time, but will hold off until vaginal swab is completed.  ?She also wonders about a UTI, but she cannot give a sample today.  She will do this at her appointment next week if needed.  Aware that if her symptoms worsen to return for further testing.  ? ?  ?Final Clinical Impressions(s) / UC Diagnoses  ? ?Final diagnoses:  ?Screen for STD (sexually transmitted disease)  ? ? ? ?Discharge Instructions   ? ?  ?You were seen today for std screening.  Today we only did the vaginal swab.  This will be resulted tomorrow and if there is anything that needs to be treated we will call you with results.  ?If you develop more symptoms of a urinary tract infection then please return for testing.  ? ? ? ?ED Prescriptions   ?None ?  ? ?PDMP not reviewed this encounter. ?  ?Jannifer Franklin, MD ?09/28/21 1520 ? ?

## 2021-09-29 LAB — CERVICOVAGINAL ANCILLARY ONLY
Bacterial Vaginitis (gardnerella): POSITIVE — AB
Candida Glabrata: NEGATIVE
Candida Vaginitis: POSITIVE — AB
Chlamydia: NEGATIVE
Comment: NEGATIVE
Comment: NEGATIVE
Comment: NEGATIVE
Comment: NEGATIVE
Comment: NEGATIVE
Comment: NORMAL
Neisseria Gonorrhea: NEGATIVE
Trichomonas: POSITIVE — AB

## 2021-10-03 ENCOUNTER — Telehealth (HOSPITAL_COMMUNITY): Payer: Self-pay | Admitting: Emergency Medicine

## 2021-10-03 MED ORDER — FLUCONAZOLE 150 MG PO TABS: 150.0000 mg | ORAL_TABLET | Freq: Once | ORAL | 0 refills | Status: AC

## 2021-10-03 MED ORDER — METRONIDAZOLE 500 MG PO TABS: 500.0000 mg | ORAL_TABLET | Freq: Two times a day (BID) | ORAL | 0 refills | Status: DC

## 2021-10-05 ENCOUNTER — Ambulatory Visit: Payer: Medicaid Other

## 2021-10-26 ENCOUNTER — Telehealth: Payer: PRIVATE HEALTH INSURANCE | Admitting: Physician Assistant

## 2021-10-26 DIAGNOSIS — G43009 Migraine without aura, not intractable, without status migrainosus: Secondary | ICD-10-CM | POA: Diagnosis not present

## 2021-10-26 MED ORDER — SUMATRIPTAN SUCCINATE 25 MG PO TABS: 25.0000 mg | ORAL_TABLET | Freq: Once | ORAL | 0 refills | Status: DC

## 2021-10-26 MED ORDER — ONDANSETRON 4 MG PO TBDP: 4.0000 mg | ORAL_TABLET | Freq: Three times a day (TID) | ORAL | 0 refills | Status: DC | PRN

## 2021-10-26 NOTE — Patient Instructions (Signed)
?  Phyllis Nichols, thank you for joining Piedad Climes, PA-C for today's virtual visit.  While this provider is not your primary care provider (PCP), if your PCP is located in our provider database this encounter information will be shared with them immediately following your visit. ? ?Consent: ?(Patient) Phyllis Nichols provided verbal consent for this virtual visit at the beginning of the encounter. ? ?Current Medications: ?No current outpatient medications on file.  ? ?Medications ordered in this encounter:  ?No orders of the defined types were placed in this encounter. ?  ? ?*If you need refills on other medications prior to your next appointment, please contact your pharmacy* ? ?Follow-Up: ?Call back or seek an in-person evaluation if the symptoms worsen or if the condition fails to improve as anticipated. ? ?Other Instructions ?Please keep well-hydrated and get plenty of rest. ?Get a little bit of caffeine in your system. ?Take the Imitrex I have sent in as directed.  ?You can use Tylenol OTC along with this if needed.  ?Rest today. ?The zofran is to use to help with nausea. ? ?If not resolving or any new/worsening symptoms, you need to be evaluated in person ASAP as discussed.  ? ? ?If you have been instructed to have an in-person evaluation today at a local Urgent Care facility, please use the link below. It will take you to a list of all of our available [facility], including address, phone number and hours of operation. Please do not delay care.  ?[facility] ? ?If you or a family member do not have a primary care provider, use the link below to schedule a visit and establish care. When you choose a [facility] primary care physician or advanced practice provider, you gain a long-term partner in health. ?Find a Primary Care Provider ? ?Learn more about [facility] in-office and virtual care options: ?[facility] Get Care Now  ?

## 2021-10-26 NOTE — Progress Notes (Signed)
?Virtual Visit Consent  ? ?Phyllis Nichols, you are scheduled for a virtual visit with a [facility] provider today.   ?  ?Just as with appointments in the office, your consent must be obtained to participate.  Your consent will be active for this visit and any virtual visit you may have with one of our providers in the next 365 days.   ?  ?If you have a MyChart account, a copy of this consent can be sent to you electronically.  All virtual visits are billed to your insurance company just like a traditional visit in the office.   ? ?As this is a virtual visit, video technology does not allow for your provider to perform a traditional examination.  This may limit your provider's ability to fully assess your condition.  If your provider identifies any concerns that need to be evaluated in person or the need to arrange testing (such as labs, EKG, etc.), we will make arrangements to do so.   ?  ?Although advances in technology are sophisticated, we cannot ensure that it will always work on either your end or our end.  If the connection with a video visit is poor, the visit may have to be switched to a telephone visit.  With either a video or telephone visit, we are not always able to ensure that we have a secure connection.    ? ?Also, by engaging in this virtual visit, you consent to the provision of healthcare. Additionally, you authorize for your insurance to be billed (if applicable) for the services provided during this visit.  ? ?I need to obtain your verbal consent now.   Are you willing to proceed with your visit today?  ?  ?Phyllis Nichols has provided verbal consent on 10/26/2021 for a virtual visit (video or telephone). ?  ?Phyllis Climes, PA-C  ? ?Date: 10/26/2021 3:31 PM ? ? ?Virtual Visit via Video Note  ? ?Phyllis Nichols, connected with  Phyllis Nichols  ([id number], 06/14/1996) on 10/26/21 at  3:15 PM EDT by a video-enabled telemedicine application and verified that I am speaking with the  correct person using two identifiers. ? ?Location: ?Patient: Virtual Visit Location Patient: Home ?Provider: Virtual Visit Location Provider: Home Office ?  ?I discussed the limitations of evaluation and management by telemedicine and the availability of in person appointments. The patient expressed understanding and agreed to proceed.   ? ?History of Present Illness: ?Phyllis Nichols is a 26 y.o. who identifies as a female who was assigned female at birth, and is being seen today for migraine headache over the past couple of days. Notes she gets migraines on occasion and usually rest will help them resolve. This migraine has been more significant and persistent over the past 2 days. Notes frontal pain now bilaterally, with photophobia, phonophobia and nausea/anorexia. Denies AMS, vision change, vomiting. Denies fever, chills, URI symptoms. 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 Took some Excedrin migraine yesterday that helped but would not get rid of the headache completely.  ? ? ?HPI: HPI  ?Problems:  ?Patient Active Problem List  ? Diagnosis Date Noted  ? Well woman exam 03/01/2021  ? Situational depression 03/21/2020  ? Screen for sexually transmitted diseases 03/21/2020  ? 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  ?Allergies:  ?Allergies  ?Allergen Reactions  ? Other   ? Cashew Nut Oil Hives  ? ?Medications:  ?Current Outpatient Medications:  ?  ondansetron (ZOFRAN-ODT) 4 MG disintegrating tablet, Take 1 tablet (4 mg total) by mouth every 8 (eight) hours as needed for nausea or vomiting., Disp: 20 tablet, Rfl: 0 ?  SUMAtriptan (IMITREX) 25 MG tablet, Take 1 tablet (25 mg total) by mouth once for 1 dose. May repeat in 2 hours if headache persists or recurs., Disp: 10 tablet, Rfl: 0 ? ?Observations/Objective: ?Patient is well-developed, well-nourished in no acute distress.  ?Resting comfortably at home.  ?Head is normocephalic, atraumatic.  ?No labored  breathing. ?Speech is clear and coherent with logical content.  ?Patient is alert and oriented at baseline.  ? ?Assessment and Plan: ?1. Migraine without aura and without status migrainosus, not intractable ?- SUMAtriptan (IMITREX) 25 MG tablet; Take 1 tablet (25 mg total) by mouth once for 1 dose. May repeat in 2 hours if headache persists or recurs.  Dispense: 10 tablet; Refill: 0 ?- ondansetron (ZOFRAN-ODT) 4 MG disintegrating tablet; Take 1 tablet (4 mg total) by mouth every 8 (eight) hours as needed for nausea or vomiting.  Dispense: 20 tablet; Refill: 0 ? ?Supportive measures and OTC medications reviewed. Rx Imitrex for abortive therapy. Tylenol OTC if needed. Caffeine recommended. Continue rest. Rx Zofran for nausea. Strict ER precautions reviewed.  ? ?Follow Up Instructions: ?I discussed the assessment and treatment plan with the patient. The patient was provided an opportunity to ask questions and all were answered. The patient agreed with the plan and demonstrated an understanding of the instructions.  A copy of instructions were sent to the patient via MyChart unless otherwise noted below.  ? ?The patient was advised to call back or seek an in-person evaluation if the symptoms worsen or if the condition fails to improve as anticipated. ? ?Time:  ?I spent 12 minutes with the patient via telehealth technology discussing the above problems/concerns.   ? ?Phyllis Climes, PA-C ?

## 2021-11-01 ENCOUNTER — Encounter: Payer: Self-pay | Admitting: Family Medicine

## 2021-11-02 ENCOUNTER — Ambulatory Visit (INDEPENDENT_AMBULATORY_CARE_PROVIDER_SITE_OTHER): Payer: No Typology Code available for payment source

## 2021-11-02 VITALS — BP 113/71 | HR 78 | Temp 98.7°F | Ht 64.0 in | Wt 148.7 lb

## 2021-11-02 DIAGNOSIS — R300 Dysuria: Secondary | ICD-10-CM

## 2021-11-02 LAB — POCT URINALYSIS DIP (DEVICE)
Glucose, UA: NEGATIVE mg/dL
Nitrite: NEGATIVE
Protein, ur: 30 mg/dL — AB
Specific Gravity, Urine: >=1.03 (ref 1.005–1.030)
Urobilinogen, UA: 0.2 mg/dL (ref 0.0–1.0)
pH: 5.5 (ref 5.0–8.0)

## 2021-11-02 NOTE — Progress Notes (Signed)
Pt here today for UTI symptoms. Pt states taking home UTI test that showed trace leukocytes. Pt advised to come to office today for UA and UC. Pt states 2 days of burning and pain with urination. Pt has not taken any OTC for treatment, just Tylenol and Ibuprofen. Pt states had fever last night of 100.0. Does not have one today.  ?Pt currently has yeast infection due to using Honeypot product. Pt states took Rx Diflucan x 1 yesterday.  ? ?Pt advised UA in office not indicative today for treatment. Will send UC for confirmation and possible treatment after results. Pt verbalized understanding.  ?Pt advised to increase water intake and take Tylenol and Ibuprofen to help with pain until results are back. Pt agreeable to plan of care.  ? ?Kendale Rembold,RN  ? ?

## 2021-11-04 LAB — URINE CULTURE

## 2024-05-07 ENCOUNTER — Ambulatory Visit (HOSPITAL_COMMUNITY)
Admission: EM | Admit: 2024-05-07 | Discharge: 2024-05-07 | Disposition: A | Discharge status: Home / Self Care | Payer: Self-pay | Attending: Physician Assistant | Admitting: Physician Assistant

## 2024-05-07 ENCOUNTER — Encounter (HOSPITAL_COMMUNITY): Payer: Self-pay

## 2024-05-07 DIAGNOSIS — R8281 Pyuria: Secondary | ICD-10-CM | POA: Insufficient documentation

## 2024-05-07 DIAGNOSIS — Z113 Encounter for screening for infections with a predominantly sexual mode of transmission: Secondary | ICD-10-CM | POA: Insufficient documentation

## 2024-05-07 LAB — POCT URINE DIPSTICK
Bilirubin, UA: NEGATIVE
Glucose, UA: NEGATIVE mg/dL
Ketones, POC UA: NEGATIVE mg/dL
Nitrite, UA: NEGATIVE
POC PROTEIN,UA: 30 — AB
Spec Grav, UA: >=1.03 — AB (ref 1.010–1.025)
Urobilinogen, UA: 1 U/dL
pH, UA: 5.5 (ref 5.0–8.0)

## 2024-05-07 LAB — POCT URINE PREGNANCY: Preg Test, Ur: NEGATIVE

## 2024-05-07 LAB — HIV ANTIBODY (ROUTINE TESTING W REFLEX): HIV Screen 4th Generation wRfx: NONREACTIVE

## 2024-05-07 NOTE — ED Triage Notes (Signed)
 Patient here today to be tested for all Stds. Patient states that she recently found out that she was being cheated on. Patient denies symptoms.

## 2024-05-07 NOTE — Discharge Instructions (Signed)
 You were seen today for STD screening.  We collected a cervicovaginal swab that we will assess for gonorrhea, chlamydia, trichomonas, bacterial vaginosis, yeast.  If collected blood work that we will assess for HIV and syphilis.  We will keep you updated with these results once they are available.  If any medications are indicated by those test results we will call you and medications will either be sent to the pharmacy on file or you can return to the urgent care for an injection.  It is recommended that you refrain from sexual activity until your test results are negative or until you have completed an appropriate medication regimen as dictated by your test results.  Please use a condom or another barrier method to help prevent STD transmission.  Please make sure that you communicate with your partners regarding your test results should any positive results, about as they will also need to be tested and screened.

## 2024-05-07 NOTE — ED Provider Notes (Signed)
 [facility]    CSN: [id number] Arrival date & time: 05/07/24  0804      History   Chief Complaint Chief Complaint  Patient presents with   SEXUALLY TRANSMITTED DISEASE    HPI Phyllis Nichols is a 28 y.o. female.  has a past medical history of Concussion, Depression, Ectopic pregnancy (07/2016), Infection, and Sexually transmissible disease.   HPI  Pt reports that she recently found out that her fiance was cheating on her- states she was planning on getting married in May but found out about the infidelity.  She has moved out and is staying with her mother right now. She states she feels safe in her current situation.  She is unsure if her previous partner has had exposures or is having symptoms. She states that he was not using protection with the other partners and she and he had unprotected sex after this, before she found out about the infidelity.  She states she is close to having her period and she is having some cramping. 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 Authorizing Provider  cetirizine (ZYRTEC) 10 MG tablet Take 10 mg by mouth daily.    [provider]    Family History Family History  Adopted: Yes  Problem Relation Age of Onset   Cancer Mother 36       lung   Cancer Maternal Uncle        prostate   Cancer Maternal Grandmother        breast    Social History Social History   Tobacco Use   Smoking status: Never   Smokeless tobacco: Never  Substance Use Topics   Alcohol use: Yes    Comment: occassional   Drug use: No     Allergies   Other and Cashew nut oil   Review of Systems Review of Systems  Constitutional:  Negative for chills and fever.  Gastrointestinal:  Positive for abdominal pain (cramping).  Genitourinary:  Positive for vaginal discharge (attributed to menses). Negative  for dysuria, genital sores, menstrual problem, pelvic pain, vaginal bleeding and vaginal pain.  Skin:  Negative for rash.     Physical Exam Triage Vital Signs ED Triage Vitals [05/07/24 0824]  Encounter Vitals Group     BP 119/83     Girls Systolic BP Percentile      Girls Diastolic BP Percentile      Boys Systolic BP Percentile      Boys Diastolic BP Percentile      Pulse Rate 69     Resp 16     Temp 98.4 F (36.9 C)     Temp Source Oral     SpO2 99 %     Weight      Height      Head Circumference      Peak Flow      Pain Score 0     Pain Loc      Pain Education      Exclude from Growth Chart    No data found.  Updated Vital Signs BP 119/83 (BP Location: Right Arm)   Pulse 69   Temp 98.4 F (36.9 C) (Oral)   Resp 16   LMP 04/10/2024 (Exact Date)   SpO2 99%   Visual Acuity Right Eye Distance:   Left Eye Distance:   Bilateral Distance:    Right Eye Near:   Left Eye Near:    Bilateral Near:     Physical Exam Vitals reviewed.  Constitutional:       General: She is awake.     Appearance: Normal appearance. She is well-developed and well-groomed.  HENT:     Head: Normocephalic and atraumatic.  Eyes:     General: Lids are normal. Gaze aligned appropriately.     Extraocular Movements: Extraocular movements intact.     Conjunctiva/sclera: Conjunctivae normal.  Pulmonary:     Effort: Pulmonary effort is normal.  Neurological:     Mental Status: She is alert and oriented to person, place, and time.  Psychiatric:        Attention and Perception: Attention and perception normal.        Mood and Affect: Mood and affect normal.        Speech: Speech normal.        Behavior: Behavior normal. Behavior is cooperative.        Thought Content: Thought content normal.        Judgment: Judgment normal.      UC Treatments / Results  Labs (all labs ordered are listed, but only abnormal results are displayed) Labs Reviewed  POCT URINE DIPSTICK - Abnormal; Notable for the following components:      Result Value   Clarity, UA cloudy (*)    Spec Grav, UA >=1.030 (*)    Blood, UA moderate (*)    POC PROTEIN,UA =30 (*)    Leukocytes, UA Trace (*)    All other components within normal limits  URINE CULTURE  HIV ANTIBODY (ROUTINE TESTING W REFLEX)  RPR  POCT URINE PREGNANCY  CERVICOVAGINAL ANCILLARY ONLY    EKG   Radiology No results found.  Procedures Procedures (including critical care time)  Medications Ordered in UC Medications - No data to display  Initial Impression / Assessment and Plan / UC Course  I have reviewed the triage vital signs and the nursing notes.  Pertinent labs & imaging results that were available during my care of the patient were reviewed by me and considered in my medical decision  making (see chart for details).  Clinical Course as of 05/07/24 1038  Thu May 07, 2024  0856 Cervicovaginal ancillary only [EM]    Clinical Course User Index [EM] Anwyn Kriegel E, PA-C    Pt states that abx pills make her  vomit even if she tries to take with food. She would prefer liquid and may benefit from Zofran  to assist with this if abx are indicated   Final Clinical Impressions(s) / UC Diagnoses   Final diagnoses:  Screening examination for STD (sexually transmitted disease)  Pyuria   Patient presents today with concerns for potential STD.  She states that she recently found out that her fianc was unfaithful and she is concerned about potential STD transmission as he did not use protection with other party and she also engaged in unprotected sex with him following this without knowledge of potential exposure.  She would like full testing today.  Reviewed that cervicovaginal swab will assess for gonorrhea, chlamydia, trichomonas.  Will check blood work to assess for HIV and syphilis.  Urine dip was notable as well as trace leukocytes.  Patient states that she is having some spotting vaginal discharge changes but she attributes this to oncoming menses.  Will send urine culture all for definitive rule out since she is not having dysuria or other UTI type symptoms.  Reviewed with patient we will contact her with the results of her testing once it is available.  Results to dictate further management.  Follow-up as needed/indicated by test results.    Discharge Instructions      You were seen today for STD screening.  We collected a cervicovaginal swab that we will assess for gonorrhea, chlamydia, trichomonas, bacterial vaginosis, yeast.  If collected blood work that we will assess for HIV and syphilis.  We will keep you updated with these results once they are available.  If any medications are indicated by those test results we will call you and medications will either be sent to the pharmacy on file or you can return to the urgent care for an injection.  It is recommended that you refrain from sexual activity until your test results are negative or until you have completed an appropriate medication regimen as  dictated by your test results.  Please use a condom or another barrier method to help prevent STD transmission.  Please make sure that you communicate with your partners regarding your test results should any positive results, about as they will also need to be tested and screened.      ED Prescriptions   None    PDMP not reviewed this encounter.   Albion Weatherholtz, Rocky BRAVO, PA-C 05/07/24 1038

## 2024-05-08 ENCOUNTER — Ambulatory Visit: Payer: Self-pay

## 2024-05-08 LAB — RPR: RPR Ser Ql: NONREACTIVE

## 2024-05-08 LAB — CERVICOVAGINAL ANCILLARY ONLY
Chlamydia: NEGATIVE
Comment: NEGATIVE
Comment: NEGATIVE
Comment: NORMAL
Neisseria Gonorrhea: NEGATIVE
Trichomonas: NEGATIVE

## 2024-05-08 LAB — URINE CULTURE: Culture: <10000 — AB

## 2024-05-23 ENCOUNTER — Encounter (HOSPITAL_COMMUNITY): Payer: Self-pay

## 2024-05-23 ENCOUNTER — Ambulatory Visit (HOSPITAL_COMMUNITY)
Admission: EM | Admit: 2024-05-23 | Discharge: 2024-05-23 | Disposition: A | Discharge status: Home / Self Care | Payer: Self-pay | Attending: Nurse Practitioner | Admitting: Nurse Practitioner

## 2024-05-23 DIAGNOSIS — Z23 Encounter for immunization: Secondary | ICD-10-CM

## 2024-05-23 DIAGNOSIS — S91011A Laceration without foreign body, right ankle, initial encounter: Secondary | ICD-10-CM

## 2024-05-23 MED ORDER — TETANUS-DIPHTH-ACELL PERTUSSIS 5-2-15.5 LF-MCG/0.5 IM SUSP
0.5000 mL | Freq: Once | INTRAMUSCULAR | Status: AC
  Administered 2024-05-23: 0.5 mL via INTRAMUSCULAR

## 2024-05-23 MED ORDER — CEPHALEXIN 500 MG PO CAPS: 500.0000 mg | ORAL_CAPSULE | Freq: Three times a day (TID) | ORAL | 0 refills | Status: AC

## 2024-05-23 MED ORDER — IBUPROFEN 800 MG PO TABS: 800.0000 mg | ORAL_TABLET | Freq: Three times a day (TID) | ORAL | 0 refills | Status: AC | PRN

## 2024-05-23 MED ORDER — TETANUS-DIPHTH-ACELL PERTUSSIS 5-2-15.5 LF-MCG/0.5 IM SUSP
INTRAMUSCULAR | Status: AC
  Filled 2024-05-23: qty 0.5
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
## 2024-05-23 NOTE — Discharge Instructions (Addendum)
 You have a cut on the back of your left ankle caused by being hit by a cart at work. Some of the skin and tissue were removed, so the wound cannot be stitched closed. We cleaned and dressed the area today and updated your tetanus shot. You were prescribed Keflex  to take for 5 days to help prevent infection. It is very important that you take this medication exactly as directed. Take the ibuprofen  as prescribed. While taking this medication, do not use over-the-counter anti-inflammatories such as aspirin, Motrin  or Aleve, as this may increase the risk of side effects. If needed, you may take Tylenol  (acetaminophen ) 1000 mg every six hours for additional pain relief. This equals two 500 mg tablets at a time. Be careful not to take more than 4000 mg of Tylenol  in a 24-hour period.  At home, gently clean the wound once or twice a day using mild antibacterial soap and water. Rinse well, pat the area dry with a clean towel, and replace the dressing as instructed. Do not use ointments, creams, or anything that keeps the wound too moist, as this can slow healing. Keep the area protected and avoid rubbing, scratching, or soaking it in water.   Watch the wound closely over the next few days. Follow up with your primary care provider if the pain is not improving, if you have questions about wound care, or if you need the area rechecked. Go to the emergency room right away if you have worsening redness, swelling, pus or a foul smell from the wound, fever, streaks of redness spreading up the leg, severe pain, or bleeding that will not stop.

## 2024-05-23 NOTE — ED Provider Notes (Signed)
 [facility]    CSN: [id number] Arrival date & time: 05/23/24  1805      History   Chief Complaint Chief Complaint  Patient presents with   Foot Injury    HPI Phyllis Nichols is a 28 y.o. female.   Discussed the use of AI scribe software for clinical note transcription with the patient, who gave verbal consent to proceed.   The patient presents with left posterior ankle pain following an injury at work today around 12 PM, when a cart struck the back of the ankle. The impact caused a laceration with significant bleeding, which the patient describes as having a flap of skin and meat missing. The pain is reported as severe. The patient has taken ibuprofen  and consumed several shots of liquor for pain control. He is concerned about possible infection. His last Tdap vaccination is unknown.  The following sections of the patient's history were reviewed and updated as appropriate: allergies, current medications, past family history, past medical history, past social history, past surgical history, and problem list.      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 Authorizing Provider  cephALEXin  (KEFLEX ) 500 MG capsule Take 1 capsule (500 mg total) by mouth 3 (three) times daily for 5 days. 05/23/24 05/28/24 Yes Storie Heffern, FNP  ibuprofen  (ADVIL ) 800 MG tablet Take 1 tablet (800 mg total) by mouth every 8 (eight) hours as needed (pain). Take with food to avoid stomach upset. Do not take any additional NSAIDs while on this. You may take tylenol  in addition to this if needed for extra pain relief. 05/23/24  Yes Iola Lukes, FNP  cetirizine (ZYRTEC) 10 MG tablet Take 10 mg by mouth daily.    [provider]    Family History Family History  Adopted: Yes  Problem Relation Age of Onset   Cancer Mother 61       lung   Cancer Maternal Uncle        prostate   Cancer Maternal Grandmother        breast    Social History Social History   Tobacco Use   Smoking status:  Never   Smokeless tobacco: Never  Substance Use Topics   Alcohol use: Yes    Comment: occassional   Drug use: No     Allergies   Other and Cashew nut oil   Review of Systems Review of Systems  Skin:  Positive for wound.  Neurological:  Negative for numbness.  All other systems reviewed and are negative.    Physical Exam Triage Vital Signs ED Triage Vitals  Encounter Vitals Group     BP 05/23/24 1929 118/77     Girls Systolic BP Percentile --      Girls Diastolic BP Percentile --      Boys Systolic BP Percentile --      Boys Diastolic BP Percentile --      Pulse Rate 05/23/24 1929 72     Resp 05/23/24 1929 16     Temp 05/23/24 1929 97.8 F (36.6 C)     Temp Source 05/23/24 1929 Oral     SpO2 05/23/24 1929 98 %     Weight --      Height --      Head Circumference --      Peak Flow --      Pain Score 05/23/24 1928 5     Pain Loc --      Pain Education --      Exclude from Growth Chart --     No data found.  Updated Vital Signs BP 118/77 (BP Location: Left Arm)   Pulse 72   Temp 97.8 F (36.6 C) (Oral)   Resp 16   LMP 04/10/2024 (Exact Date)   SpO2 98%   Visual Acuity Right Eye Distance:   Left Eye Distance:   Bilateral Distance:    Right Eye Near:   Left Eye Near:    Bilateral Near:     Physical Exam Vitals reviewed.  Constitutional:      General: She is awake. She is not in acute distress.    Appearance: Normal appearance. She is well-developed. She is not ill-appearing, toxic-appearing or diaphoretic.  HENT:     Head: Normocephalic.     Right Ear: Hearing normal.     Left Ear: Hearing normal.     Nose: Nose normal.     Mouth/Throat:     Mouth: Mucous membranes are moist.  Eyes:     General: Vision grossly intact.     Conjunctiva/sclera: Conjunctivae normal.  Cardiovascular:     Rate and Rhythm: Normal rate and regular rhythm.     Heart sounds: Normal heart sounds.  Pulmonary:     Effort: Pulmonary effort is normal.     Breath sounds: Normal breath sounds and air entry.  Musculoskeletal:        General: Normal range of motion.     Cervical back: Normal range of motion and neck supple.  Skin:    General: Skin is warm and dry.     Findings: Laceration present.     Comments: Posterior right ankle reveals a rounded, irregularly shaped laceration measuring approximately 2 cm in diameter. There is an area of missing skin, preventing approximation of the wound edges. No active bleeding or drainage is present. There is no surrounding erythema, and no significant swelling is noted. The area is tender to palpation. Neurovascular status is intact.  Neurological:     General: No focal deficit present.     Mental Status: She is alert and oriented to person, place, and time.  Psychiatric:  Speech: Speech normal.        Behavior: Behavior is cooperative.      UC Treatments / Results  Labs (all labs ordered are listed, but only abnormal results are  displayed) Labs Reviewed - No data to display  EKG   Radiology No results found.  Procedures Wound Care  Date/Time: 05/23/2024 8:20 PM  Performed by: Shona Elenor RAMAN, RN Authorized by: Iola Lukes, FNP   Consent:    Consent obtained:  Verbal   Consent given by:  Patient   Risks, benefits, and alternatives were discussed: yes     Risks discussed:  Bleeding, infection and pain   Alternatives discussed:  No treatment Universal protocol:    Patient identity confirmed:  Verbally with patient and arm band Sedation:    Sedation type:  None Anesthesia:    Anesthesia method:  None Post-procedure details:    Procedure completion:  Tolerated well, no immediate complications Comments:     Wound to right posterior ankle cleansed. Bacitracin ointment applied. Nonadherent dressing placed.   (including critical care time)  Medications Ordered in UC Medications  Tdap (ADACEL ) injection 0.5 mL (0.5 mLs Intramuscular Given 05/23/24 2010)    Initial Impression / Assessment and Plan / UC Course  I have reviewed the triage vital signs and the nursing notes.  Pertinent labs & imaging results that were available during my care of the patient were reviewed by me and considered in my medical decision making (see chart for details).     The patient presents with a traumatic laceration to the posterior left ankle after being struck by a cart at work. The injury resulted in a partial skin-and-tissue avulsion with significant initial bleeding. Because tissue is missing, the wound is not suitable for closure. The patient reports severe pain and has taken ibuprofen  along with alcohol prior to arrival. The wound was thoroughly cleansed and dressed in clinic. A 5-day course of Keflex  was prescribed to reduce the risk of infection, and tetanus immunization was updated. The patient was given detailed wound-care instructions, including gently cleaning the area with mild antibacterial soap and water,  patting dry, and avoiding ointments or creams, which may interfere with healing. A work excuse can be provided as needed. The patient was advised to follow up with primary care or return to the clinic for worsening pain, spreading redness, increased swelling, drainage with odor, fever, or any concerns. Immediate emergency evaluation is recommended for uncontrolled bleeding, severe swelling, or signs of rapidly progressing infection.  Today's evaluation has revealed no signs of a dangerous process. Discussed diagnosis with patient and/or guardian. Patient and/or guardian aware of their diagnosis, possible red flag symptoms to watch out for and need for close follow up. Patient and/or guardian understands verbal and written discharge instructions. Patient and/or guardian comfortable with plan and disposition.  Patient and/or guardian has a clear mental status at this time, good insight into illness (after discussion and teaching) and has clear judgment to make decisions regarding their care  Documentation was completed with the aid of voice recognition software. Transcription may contain typographical errors.  Final Clinical Impressions(s) / UC Diagnoses   Final diagnoses:  Laceration of right ankle, initial encounter     Discharge Instructions      You have a cut on the back of your left ankle caused by being hit by a cart at work. Some of the skin and tissue were removed, so the wound cannot be stitched closed. We cleaned and dressed the area  today and updated your tetanus shot. You were prescribed Keflex  to take for 5 days to help prevent infection. It is very important that you take this medication exactly as directed. Take the ibuprofen  as prescribed. While taking this medication, do not use over-the-counter anti-inflammatories such as aspirin, Motrin  or Aleve, as this may increase the risk of side effects. If needed, you may take Tylenol  (acetaminophen ) 1000 mg every six hours for additional pain  relief. This equals two 500 mg tablets at a time. Be careful not to take more than 4000 mg of Tylenol  in a 24-hour period.  At home, gently clean the wound once or twice a day using mild antibacterial soap and water. Rinse well, pat the area dry with a clean towel, and replace the dressing as instructed. Do not use ointments, creams, or anything that keeps the wound too moist, as this can slow healing. Keep the area protected and avoid rubbing, scratching, or soaking it in water.   Watch the wound closely over the next few days. Follow up with your primary care provider if the pain is not improving, if you have questions about wound care, or if you need the area rechecked. Go to the emergency room right away if you have worsening redness, swelling, pus or a foul smell from the wound, fever, streaks of redness spreading up the leg, severe pain, or bleeding that will not stop.     ED Prescriptions     Medication Sig Dispense Auth. Provider   cephALEXin  (KEFLEX ) 500 MG capsule Take 1 capsule (500 mg total) by mouth 3 (three) times daily for 5 days. 15 capsule Iola Lukes, FNP   ibuprofen  (ADVIL ) 800 MG tablet Take 1 tablet (800 mg total) by mouth every 8 (eight) hours as needed (pain). Take with food to avoid stomach upset. Do not take any additional NSAIDs while on this. You may take tylenol  in addition to this if needed for extra pain relief. 21 tablet Iola Lukes, FNP      PDMP not reviewed this encounter.   Iola Lukes, [place] 05/23/24 2021
